# Patient Record
Sex: Male | Born: 1985
Health system: Southern US, Community
[De-identification: ages and names within clinical notes are randomized; demographics above are authoritative.]

## PROBLEM LIST (undated history)

## (undated) DIAGNOSIS — I82409 Acute embolism and thrombosis of unspecified deep veins of unspecified lower extremity: Secondary | ICD-10-CM

## (undated) DIAGNOSIS — M1990 Unspecified osteoarthritis, unspecified site: Secondary | ICD-10-CM

## (undated) DIAGNOSIS — T7840XA Allergy, unspecified, initial encounter: Secondary | ICD-10-CM

## (undated) DIAGNOSIS — I10 Essential (primary) hypertension: Secondary | ICD-10-CM

## (undated) HISTORY — DX: Unspecified osteoarthritis, unspecified site: M19.90

## (undated) HISTORY — DX: Allergy, unspecified, initial encounter: T78.40XA

---

## 2004-09-09 ENCOUNTER — Emergency Department: Payer: Self-pay | Admitting: Emergency Medicine

## 2007-11-21 ENCOUNTER — Emergency Department: Payer: Self-pay | Admitting: Emergency Medicine

## 2010-04-15 ENCOUNTER — Emergency Department: Payer: Self-pay | Admitting: Emergency Medicine

## 2011-06-07 ENCOUNTER — Emergency Department: Payer: Self-pay | Admitting: Emergency Medicine

## 2011-06-07 LAB — COMPREHENSIVE METABOLIC PANEL
Alkaline Phosphatase: 82 U/L (ref 50–136)
Anion Gap: 10 (ref 7–16)
BUN: 13 mg/dL (ref 7–18)
Calcium, Total: 9.3 mg/dL (ref 8.5–10.1)
Chloride: 106 mmol/L (ref 98–107)
Co2: 23 mmol/L (ref 21–32)
Creatinine: 1.43 mg/dL — ABNORMAL HIGH (ref 0.60–1.30)
EGFR (African American): >60
EGFR (Non-African Amer.): >60
Glucose: 104 mg/dL — ABNORMAL HIGH (ref 65–99)
Potassium: 4.1 mmol/L (ref 3.5–5.1)
Sodium: 139 mmol/L (ref 136–145)

## 2011-06-07 LAB — CBC
HGB: 15.6 g/dL (ref 13.0–18.0)
MCHC: 34.1 g/dL (ref 32.0–36.0)
Platelet: 167 10*3/uL (ref 150–440)
WBC: 9.5 10*3/uL (ref 3.8–10.6)

## 2011-06-07 LAB — TROPONIN I: Troponin-I: <0.02 ng/mL

## 2011-06-07 LAB — CK: CK, Total: 600 U/L — ABNORMAL HIGH (ref 35–232)

## 2013-10-30 ENCOUNTER — Emergency Department: Payer: Self-pay | Admitting: Emergency Medicine

## 2014-08-12 ENCOUNTER — Encounter: Payer: Self-pay | Admitting: Emergency Medicine

## 2014-08-12 ENCOUNTER — Emergency Department
Admission: EM | Admit: 2014-08-12 | Discharge: 2014-08-12 | Disposition: A | Discharge status: Home / Self Care | Payer: BLUE CROSS/BLUE SHIELD | Attending: Emergency Medicine | Admitting: Emergency Medicine

## 2014-08-12 DIAGNOSIS — S39012A Strain of muscle, fascia and tendon of lower back, initial encounter: Secondary | ICD-10-CM | POA: Insufficient documentation

## 2014-08-12 DIAGNOSIS — S3992XA Unspecified injury of lower back, initial encounter: Secondary | ICD-10-CM | POA: Diagnosis present

## 2014-08-12 DIAGNOSIS — X58XXXA Exposure to other specified factors, initial encounter: Secondary | ICD-10-CM | POA: Insufficient documentation

## 2014-08-12 DIAGNOSIS — Y92310 Basketball court as the place of occurrence of the external cause: Secondary | ICD-10-CM | POA: Insufficient documentation

## 2014-08-12 DIAGNOSIS — Y998 Other external cause status: Secondary | ICD-10-CM | POA: Diagnosis not present

## 2014-08-12 DIAGNOSIS — Y9367 Activity, basketball: Secondary | ICD-10-CM | POA: Insufficient documentation

## 2014-08-12 MED ORDER — DIAZEPAM 5 MG PO TABS
ORAL_TABLET | ORAL | Status: AC
  Administered 2014-08-12: 5 mg via ORAL
  Filled 2014-08-12: qty 1

## 2014-08-12 MED ORDER — DIAZEPAM 5 MG PO TABS
5.0000 mg | ORAL_TABLET | Freq: Once | ORAL | Status: AC
  Administered 2014-08-12: 5 mg via ORAL

## 2014-08-12 MED ORDER — IBUPROFEN 800 MG PO TABS: 800.0000 mg | ORAL_TABLET | Freq: Three times a day (TID) | ORAL | Status: DC | PRN

## 2014-08-12 MED ORDER — KETOROLAC TROMETHAMINE 60 MG/2ML IM SOLN
60.0000 mg | Freq: Once | INTRAMUSCULAR | Status: AC
  Administered 2014-08-12: 60 mg via INTRAMUSCULAR

## 2014-08-12 MED ORDER — KETOROLAC TROMETHAMINE 60 MG/2ML IM SOLN
INTRAMUSCULAR | Status: AC
  Administered 2014-08-12: 60 mg via INTRAMUSCULAR
  Filled 2014-08-12: qty 2

## 2014-08-12 MED ORDER — DIAZEPAM 2 MG PO TABS: 2.0000 mg | ORAL_TABLET | Freq: Three times a day (TID) | ORAL | Status: DC | PRN

## 2014-08-12 NOTE — ED Notes (Signed)
Pt was playing basketball at 1830 when he reached up to grab ball and "pain shot down my back and lower leg". Pt complains of low back pain and left posterior leg pain. Cms intact to all toes.

## 2014-08-12 NOTE — Discharge Instructions (Signed)
Back Pain, Adult Low back pain is very common. About 1 in 5 people have back pain.The cause of low back pain is rarely dangerous. The pain often gets better over time.About half of people with a sudden onset of back pain feel better in just 2 weeks. About 8 in 10 people feel better by 6 weeks.  CAUSES Some common causes of back pain include:  Strain of the muscles or ligaments supporting the spine.  Wear and tear (degeneration) of the spinal discs.  Arthritis.  Direct injury to the back. DIAGNOSIS Most of the time, the direct cause of low back pain is not known.However, back pain can be treated effectively even when the exact cause of the pain is unknown.Answering your caregiver's questions about your overall health and symptoms is one of the most accurate ways to make sure the cause of your pain is not dangerous. If your caregiver needs more information, he or she may order lab work or imaging tests (X-rays or MRIs).However, even if imaging tests show changes in your back, this usually does not require surgery. HOME CARE INSTRUCTIONS For many people, back pain returns.Since low back pain is rarely dangerous, it is often a condition that people can learn to manageon their own.   Remain active. It is stressful on the back to sit or stand in one place. Do not sit, drive, or stand in one place for more than 30 minutes at a time. Take short walks on level surfaces as soon as pain allows.Try to increase the length of time you walk each day.  Do not stay in bed.Resting more than 1 or 2 days can delay your recovery.  Do not avoid exercise or work.Your body is made to move.It is not dangerous to be active, even though your back may hurt.Your back will likely heal faster if you return to being active before your pain is gone.  Pay attention to your body when you bend and lift. Many people have less discomfortwhen lifting if they bend their knees, keep the load close to their bodies,and  avoid twisting. Often, the most comfortable positions are those that put less stress on your recovering back.  Find a comfortable position to sleep. Use a firm mattress and lie on your side with your knees slightly bent. If you lie on your back, put a pillow under your knees.  Only take over-the-counter or prescription medicines as directed by your caregiver. Over-the-counter medicines to reduce pain and inflammation are often the most helpful.Your caregiver may prescribe muscle relaxant drugs.These medicines help dull your pain so you can more quickly return to your normal activities and healthy exercise.  Put ice on the injured area.  Put ice in a plastic bag.  Place a towel between your skin and the bag.  Leave the ice on for 15-20 minutes, 03-04 times a day for the first 2 to 3 days. After that, ice and heat may be alternated to reduce pain and spasms.  Ask your caregiver about trying back exercises and gentle massage. This may be of some benefit.  Avoid feeling anxious or stressed.Stress increases muscle tension and can worsen back pain.It is important to recognize when you are anxious or stressed and learn ways to manage it.Exercise is a great option. SEEK MEDICAL CARE IF:  You have pain that is not relieved with rest or medicine.  You have pain that does not improve in 1 week.  You have new symptoms.  You are generally not feeling well. SEEK   IMMEDIATE MEDICAL CARE IF:   You have pain that radiates from your back into your legs.  You develop new bowel or bladder control problems.  You have unusual weakness or numbness in your arms or legs.  You develop nausea or vomiting.  You develop abdominal pain.  You feel faint. Document Released: 02/02/2005 Document Revised: 08/04/2011 Document Reviewed: 06/06/2013 [employer] Patient Information 2015 [employer]. This information is not intended to replace advice given to you by your health care provider. Make sure you  discuss any questions you have with your health care provider.  

## 2014-08-12 NOTE — ED Notes (Signed)
Patient with complaint of left lower back pain that radiates down his left leg that started about 3 hours ago.

## 2014-08-12 NOTE — ED Provider Notes (Signed)
[facility] Emergency Department Provider Note ____________________________________________  Time seen: Approximately 10:48 PM  I have reviewed the triage vital signs and the nursing notes.   HISTORY  Chief Complaint Back Pain   HPI HULET EHRMANN is a 29 y.o. male who presents to the emergency department for left lower back pain. He states that he was playing basketball and went up to catch a shot and felt a pop and sudden pain in his back. He states the pain radiates into the left leg posteriorly into the left knee. He has never had this type of pain before. Pain is worse with movement.   History reviewed. No pertinent past medical history.  There are no active problems to display for this patient.   History reviewed. No pertinent past surgical history.  Current Outpatient Rx  Name  Route  Sig  Dispense  Refill  . diazepam (VALIUM) 2 MG tablet   Oral   Take 1 tablet (2 mg total) by mouth every 8 (eight) hours as needed for anxiety.   30 tablet   0   . ibuprofen (ADVIL,MOTRIN) 800 MG tablet   Oral   Take 1 tablet (800 mg total) by mouth every 8 (eight) hours as needed.   30 tablet   0     Allergies Review of patient's allergies indicates no known allergies.  No family history on file.  Social History History  Substance Use Topics  . Smoking status: Not on file  . Smokeless tobacco: Not on file  . Alcohol Use: Not on file    Review of Systems Constitutional: No recent illness. Eyes: No visual changes. ENT: No sore throat. Cardiovascular: Denies chest pain or palpitations. Respiratory: Denies shortness of breath. Gastrointestinal: No abdominal pain.  Genitourinary: Negative for dysuria. Musculoskeletal: Pain in left lumbar area with radiation into the left lower extremity. Skin: Negative for rash. Neurological: Negative for headaches, focal weakness or numbness. 10-point ROS otherwise  negative.  ____________________________________________   PHYSICAL EXAM:  VITAL SIGNS: ED Triage Vitals  Enc Vitals Group     BP 08/12/14 2026 119/78 mmHg     Pulse Rate 08/12/14 2026 73     Resp 08/12/14 2026 20     Temp 08/12/14 2026 97.5 F (36.4 C)     Temp Source 08/12/14 2026 Oral     SpO2 08/12/14 2005 98 %     Weight 08/12/14 2026 271 lb (122.925 kg)     Height 08/12/14 2026  (1.93 m)     Head Cir --      Peak Flow --      Pain Score 08/12/14 2026 8     Pain Loc --      Pain Edu? --      Excl. in GC? --     Constitutional: Alert and oriented. Well appearing and in no acute distress. Eyes: Conjunctivae are normal. EOMI. Head: Atraumatic. Nose: No congestion/rhinnorhea. Neck: No stridor.  Respiratory: Normal respiratory effort.   Musculoskeletal: Full range of motion possible but painful flexion. Straight leg raise induces sciatic pain at approximately 45-50. Neurologic:  Normal speech and language. No gross focal neurologic deficits are appreciated. Speech is normal. Antalgic gait. No loss of bowel or bladder control. Skin:  Skin is warm, dry and intact. Atraumatic. Psychiatric: Mood and affect are normal. Speech and behavior are normal.  ____________________________________________   LABS (all labs ordered are listed, but only abnormal results are displayed)  Labs Reviewed - No data to  display ____________________________________________  RADIOLOGY  Not indicated ____________________________________________   PROCEDURES  Procedure(s) performed: None   ____________________________________________   INITIAL IMPRESSION / ASSESSMENT AND PLAN / ED COURSE  Pertinent labs & imaging results that were available during my care of the patient were reviewed by me and considered in my medical decision making (see chart for details).  Toradol IM given in the emergency department and 5 mg of Valium. Patient was advised to use ice 20 minutes per hour on  the lower back. He was advised to rest over the next couple of days. He was advised to follow up with orthopedics or primary care if the pain does not improve. He was advised to return to emergency department for symptoms that change or worsen if he is unable to schedule an appointment. ____________________________________________   FINAL CLINICAL IMPRESSION(S) / ED DIAGNOSES  Final diagnoses:  Back strain, initial encounter       Chinita Pester, FNP 08/12/14 2252  Phineas Semen, MD 08/12/14 2259

## 2014-12-13 ENCOUNTER — Emergency Department
Admission: EM | Admit: 2014-12-13 | Discharge: 2014-12-13 | Disposition: A | Discharge status: Home / Self Care | Payer: BLUE CROSS/BLUE SHIELD | Attending: Emergency Medicine | Admitting: Emergency Medicine

## 2014-12-13 ENCOUNTER — Encounter: Payer: Self-pay | Admitting: Emergency Medicine

## 2014-12-13 ENCOUNTER — Emergency Department: Payer: BLUE CROSS/BLUE SHIELD

## 2014-12-13 DIAGNOSIS — M545 Low back pain, unspecified: Secondary | ICD-10-CM

## 2014-12-13 DIAGNOSIS — M5136 Other intervertebral disc degeneration, lumbar region: Secondary | ICD-10-CM | POA: Diagnosis not present

## 2014-12-13 DIAGNOSIS — M51369 Other intervertebral disc degeneration, lumbar region without mention of lumbar back pain or lower extremity pain: Secondary | ICD-10-CM

## 2014-12-13 MED ORDER — MELOXICAM 15 MG PO TABS: 15.0000 mg | ORAL_TABLET | Freq: Every day | ORAL | Status: DC

## 2014-12-13 NOTE — ED Provider Notes (Signed)
[facility] Emergency Department Provider Note  ____________________________________________  Time seen: Approximately 12:44 PM  I have reviewed the triage vital signs and the nursing notes.   HISTORY  Chief Complaint Back Pain    HPI Stuart Mclaughlin is a 29 y.o. male who presents to the emergency department for evaluation of back pain. He states about 3 months ago he injured his back while playing basketball. He states that he jumped up for a layup and felt something pull in his back then landed hard on that right leg. He was evaluated after the injury, but no x-ray was taken. He states that he has begun to have pain in the same area of his back, but it is now radiating down his right leg. He denies new injury. He has not been taken anything for pain. He denies loss of bowel or bladder control.   History reviewed. No pertinent past medical history.  There are no active problems to display for this patient.   History reviewed. No pertinent past surgical history.  Current Outpatient Rx  Name  Route  Sig  Dispense  Refill  . diazepam (VALIUM) 2 MG tablet   Oral   Take 1 tablet (2 mg total) by mouth every 8 (eight) hours as needed for anxiety.   30 tablet   0   . ibuprofen (ADVIL,MOTRIN) 800 MG tablet   Oral   Take 1 tablet (800 mg total) by mouth every 8 (eight) hours as needed.   30 tablet   0   . meloxicam (MOBIC) 15 MG tablet   Oral   Take 1 tablet (15 mg total) by mouth daily.   30 tablet   2     Allergies Review of patient's allergies indicates no known allergies.  No family history on file.  Social History Social History  Substance Use Topics  . Smoking status: Never Smoker   . Smokeless tobacco: None  . Alcohol Use: Yes    Review of Systems Constitutional: No recent illness. Eyes: No visual changes. ENT: No sore throat. Cardiovascular: Denies chest pain or palpitations. Respiratory: Denies shortness of  breath. Gastrointestinal: No abdominal pain.  Genitourinary: Negative for dysuria. Musculoskeletal: Pain in right lower back Skin: Negative for rash. Neurological: Negative for headaches, focal weakness or numbness. 10-point ROS otherwise negative.  ____________________________________________   PHYSICAL EXAM:  VITAL SIGNS: ED Triage Vitals  Enc Vitals Group     BP 12/13/14 1223 147/94 mmHg     Pulse Rate 12/13/14 1223 61     Resp 12/13/14 1223 14     Temp 12/13/14 1223 97.7 F (36.5 C)     Temp Source 12/13/14 1223 Oral     SpO2 12/13/14 1223 97 %     Weight 12/13/14 1223 277 lb (125.646 kg)     Height 12/13/14 1223  (1.93 m)     Head Cir --      Peak Flow --      Pain Score 12/13/14 1224 7     Pain Loc --      Pain Edu? --      Excl. in GC? --     Constitutional: Alert and oriented. Well appearing and in no acute distress. Eyes: Conjunctivae are normal. EOMI. Head: Atraumatic. Nose: No congestion/rhinnorhea. Neck: No stridor.  Respiratory: Normal respiratory effort.   Musculoskeletal: Tenderness into the right lower back. Straight leg raise is positive at about 50. Full range of motion of all extremities. Neurologic:  Normal  speech and language. No gross focal neurologic deficits are appreciated. Speech is normal. No gait instability. Skin:  Skin is warm, dry and intact. Atraumatic. Psychiatric: Mood and affect are normal. Speech and behavior are normal.  ____________________________________________   LABS (all labs ordered are listed, but only abnormal results are displayed)  Labs Reviewed - No data to display ____________________________________________  RADIOLOGY  No acute bony abnormality of the lumbar spine per radiology. There is mild disc degeneration noted. ____________________________________________   PROCEDURES  Procedure(s) performed: None   ____________________________________________   INITIAL IMPRESSION / ASSESSMENT AND PLAN /  ED COURSE  Pertinent labs & imaging results that were available during my care of the patient were reviewed by me and considered in my medical decision making (see chart for details).  Patient was instructed to follow-up with orthopedics. He was advised to take medication as prescribed. He was advised to return to the emergency department for symptoms that change or worsen if he is unable schedule an appointment. ____________________________________________   FINAL CLINICAL IMPRESSION(S) / ED DIAGNOSES  Final diagnoses:  Acute lumbar back pain  Degenerative disc disease, lumbar       Chinita PesterCari B Hollie Wojahn, FNP 12/13/14 1807  Emily FilbertJonathan E Williams, MD 12/14/14 2249

## 2014-12-13 NOTE — Discharge Instructions (Signed)

## 2014-12-13 NOTE — ED Notes (Signed)
Low back pain. Hx of same, but it is going down right leg now

## 2015-03-12 ENCOUNTER — Emergency Department: Payer: BLUE CROSS/BLUE SHIELD

## 2015-03-12 ENCOUNTER — Encounter: Payer: Self-pay | Admitting: Emergency Medicine

## 2015-03-12 ENCOUNTER — Emergency Department
Admission: EM | Admit: 2015-03-12 | Discharge: 2015-03-12 | Disposition: A | Discharge status: Home / Self Care | Payer: BLUE CROSS/BLUE SHIELD | Attending: Emergency Medicine | Admitting: Emergency Medicine

## 2015-03-12 DIAGNOSIS — M25561 Pain in right knee: Secondary | ICD-10-CM | POA: Diagnosis present

## 2015-03-12 DIAGNOSIS — I8391 Asymptomatic varicose veins of right lower extremity: Secondary | ICD-10-CM | POA: Insufficient documentation

## 2015-03-12 DIAGNOSIS — M79604 Pain in right leg: Secondary | ICD-10-CM

## 2015-03-12 MED ORDER — KETOROLAC TROMETHAMINE 60 MG/2ML IM SOLN
INTRAMUSCULAR | Status: AC
  Administered 2015-03-12: 60 mg via INTRAMUSCULAR
  Filled 2015-03-12: qty 2

## 2015-03-12 MED ORDER — KETOROLAC TROMETHAMINE 60 MG/2ML IM SOLN
60.0000 mg | Freq: Once | INTRAMUSCULAR | Status: AC
  Administered 2015-03-12: 60 mg via INTRAMUSCULAR

## 2015-03-12 MED ORDER — NAPROXEN 500 MG PO TABS: 500.0000 mg | ORAL_TABLET | Freq: Two times a day (BID) | ORAL | Status: DC

## 2015-03-12 MED ORDER — TRAMADOL HCL 50 MG PO TABS: 50.0000 mg | ORAL_TABLET | Freq: Four times a day (QID) | ORAL | Status: DC | PRN

## 2015-03-12 NOTE — Discharge Instructions (Signed)
Cryotherapy °Cryotherapy means treatment with cold. Ice or gel packs can be used to reduce both pain and swelling. Ice is the most helpful within the first 24 to 48 hours after an injury or flare-up from overusing a muscle or joint. Sprains, strains, spasms, burning pain, shooting pain, and aches can all be eased with ice. Ice can also be used when recovering from surgery. Ice is effective, has very few side effects, and is safe for most people to use. °PRECAUTIONS  °Ice is not a safe treatment option for people with: °· Raynaud phenomenon. This is a condition affecting small blood vessels in the extremities. Exposure to cold may cause your problems to return. °· Cold hypersensitivity. There are many forms of cold hypersensitivity, including: °· Cold urticaria. Red, itchy hives appear on the skin when the tissues begin to warm after being iced. °· Cold erythema. This is a red, itchy rash caused by exposure to cold. °· Cold hemoglobinuria. Red blood cells break down when the tissues begin to warm after being iced. The hemoglobin that carry oxygen are passed into the urine because they cannot combine with blood proteins fast enough. °· Numbness or altered sensitivity in the area being iced. °If you have any of the following conditions, do not use ice until you have discussed cryotherapy with your caregiver: °· Heart conditions, such as arrhythmia, angina, or chronic heart disease. °· High blood pressure. °· Healing wounds or open skin in the area being iced. °· Current infections. °· Rheumatoid arthritis. °· Poor circulation. °· Diabetes. °Ice slows the blood flow in the region it is applied. This is beneficial when trying to stop inflamed tissues from spreading irritating chemicals to surrounding tissues. However, if you expose your skin to cold temperatures for too long or without the proper protection, you can damage your skin or nerves. Watch for signs of skin damage due to cold. °HOME CARE INSTRUCTIONS °Follow  these tips to use ice and cold packs safely. °· Place a dry or damp towel between the ice and skin. A damp towel will cool the skin more quickly, so you may need to shorten the time that the ice is used. °· For a more rapid response, add gentle compression to the ice. °· Ice for no more than 10 to 20 minutes at a time. The bonier the area you are icing, the less time it will take to get the benefits of ice. °· Check your skin after 5 minutes to make sure there are no signs of a poor response to cold or skin damage. °· Rest 20 minutes or more between uses. °· Once your skin is numb, you can end your treatment. You can test numbness by very lightly touching your skin. The touch should be so light that you do not see the skin dimple from the pressure of your fingertip. When using ice, most people will feel these normal sensations in this order: cold, burning, aching, and numbness. °· Do not use ice on someone who cannot communicate their responses to pain, such as small children or people with dementia. °HOW TO MAKE AN ICE PACK °Ice packs are the most common way to use ice therapy. Other methods include ice massage, ice baths, and cryosprays. Muscle creams that cause a cold, tingly feeling do not offer the same benefits that ice offers and should not be used as a substitute unless recommended by your caregiver. °To make an ice pack, do one of the following: °· Place crushed ice or a   bag of frozen vegetables in a sealable plastic bag. Squeeze out the excess air. Place this bag inside another plastic bag. Slide the bag into a pillowcase or place a damp towel between your skin and the bag.  Mix 3 parts water with 1 part rubbing alcohol. Freeze the mixture in a sealable plastic bag. When you remove the mixture from the freezer, it will be slushy. Squeeze out the excess air. Place this bag inside another plastic bag. Slide the bag into a pillowcase or place a damp towel between your skin and the bag. SEEK MEDICAL CARE  IF:  You develop white spots on your skin. This may give the skin a blotchy (mottled) appearance.  Your skin turns blue or pale.  Your skin becomes waxy or hard.  Your swelling gets worse. MAKE SURE YOU:   Understand these instructions.  Will watch your condition.  Will get help right away if you are not doing well or get worse.   This information is not intended to replace advice given to you by your health care provider. Make sure you discuss any questions you have with your health care provider.   Document Released: 09/29/2010 Document Revised: 02/23/2014 Document Reviewed: 09/29/2010 Elsevier Interactive Patient Education 2016 [employer].  Joint Pain Joint pain, which is also called arthralgia, can be caused by many things. Joint pain often goes away when you follow your health care provider's instructions for relieving pain at home. However, joint pain can also be caused by conditions that require further treatment. Common causes of joint pain include:  Bruising in the area of the joint.  Overuse of the joint.  Wear and tear on the joints that occur with aging (osteoarthritis).  Various other forms of arthritis.  A buildup of a crystal form of uric acid in the joint (gout).  Infections of the joint (septic arthritis) or of the bone (osteomyelitis). Your health care provider may recommend medicine to help with the pain. If your joint pain continues, additional tests may be needed to diagnose your condition. HOME CARE INSTRUCTIONS Watch your condition for any changes. Follow these instructions as directed to lessen the pain that you are feeling.  Take medicines only as directed by your health care provider.  Rest the affected area for as long as your health care provider says that you should. If directed to do so, raise the painful joint above the level of your heart while you are sitting or lying down.  Do not do things that cause or worsen pain.  If directed,  apply ice to the painful area:  Put ice in a plastic bag.  Place a towel between your skin and the bag.  Leave the ice on for 20 minutes, 2-3 times per day.  Wear an elastic bandage, splint, or sling as directed by your health care provider. Loosen the elastic bandage or splint if your fingers or toes become numb and tingle, or if they turn cold and blue.  Begin exercising or stretching the affected area as directed by your health care provider. Ask your health care provider what types of exercise are safe for you.  Keep all follow-up visits as directed by your health care provider. This is important. SEEK MEDICAL CARE IF:  Your pain increases, and medicine does not help.  Your joint pain does not improve within 3 days.  You have increased bruising or swelling.  You have a fever.  You lose 10 lb (4.5 kg) or more without trying. SEEK IMMEDIATE  MEDICAL CARE IF:  You are not able to move the joint.  Your fingers or toes become numb or they turn cold and blue.   This information is not intended to replace advice given to you by your health care provider. Make sure you discuss any questions you have with your health care provider.   Document Released: 02/02/2005 Document Revised: 02/23/2014 Document Reviewed: 11/14/2013 Elsevier Interactive Patient Education 2016 [employer]  Varicose Veins Varicose veins are veins that have become enlarged and twisted. They are usually seen in the legs but can occur in other parts of the body as well. CAUSES This condition is the result of valves in the veins not working properly. Valves in the veins help to return blood from the leg to the heart. If these valves are damaged, blood flows backward and backs up into the veins in the leg near the skin. This causes the veins to become larger. RISK FACTORS People who are on their feet a lot, who are pregnant, or who are overweight are more likely to develop varicose veins. SIGNS AND  SYMPTOMS  Bulging, twisted-appearing, bluish veins, most commonly found on the legs.  Leg pain or a feeling of heaviness. These symptoms may be worse at the end of the day.  Leg swelling.  Changes in skin color. DIAGNOSIS A health care provider can usually diagnose varicose veins by examining your legs. Your health care provider may also recommend an ultrasound of your leg veins. TREATMENT Most varicose veins can be treated at home.However, other treatments are available for people who have persistent symptoms or want to improve the cosmetic appearance of the varicose veins. These treatment options include:  Sclerotherapy. A solution is injected into the vein to close it off.  Laser treatment. A laser is used to heat the vein to close it off.  Radiofrequency vein ablation. An electrical current produced by radio waves is used to close off the vein.  Phlebectomy. The vein is surgically removed through small incisions made over the varicose vein.  Vein ligation and stripping. The vein is surgically removed through incisions made over the varicose vein after the vein has been tied (ligated). HOME CARE INSTRUCTIONS  Do not stand or sit in one position for long periods of time. Do not sit with your legs crossed. Rest with your legs raised during the day.  Wear compression stockings as directed by your health care provider. These stockings help to prevent blood clots and reduce swelling in your legs.  Do not wear other tight, encircling garments around your legs, pelvis, or waist.  Walk as much as possible to increase blood flow.  Raise the foot of your bed at night with 2-inch blocks.  If you get a cut in the skin over the vein and the vein bleeds, lie down with your leg raised and press on it with a clean cloth until the bleeding stops. Then place a bandage (dressing) on the cut. See your health care provider if it continues to bleed. SEEK MEDICAL CARE IF:  The skin around your  ankle starts to break down.  You have pain, redness, tenderness, or hard swelling in your leg over a vein.  You are uncomfortable because of leg pain.   This information is not intended to replace advice given to you by your health care provider. Make sure you discuss any questions you have with your health care provider.   Document Released: 11/12/2004 Document Revised: 02/23/2014 Document Reviewed: 06/20/2013 Elsevier Interactive Patient Education  2016 Elsevier Inc. ° °

## 2015-03-12 NOTE — ED Notes (Signed)
Woke up with pain to right knee and ankle this am  Unsure of injury  Had slight pain to knee last pm

## 2015-03-12 NOTE — ED Notes (Signed)
States he noticed some slight pain to right knee last pm  But woke up this am with swelling to right ankle and unable to bear wt to right leg  Denies any injury   Good sensation and pulses

## 2015-03-12 NOTE — ED Provider Notes (Signed)
[facility] Emergency Department Provider Note  ____________________________________________  Time seen: Approximately 7:45 AM  I have reviewed the triage vital signs and the nursing notes.   HISTORY  Chief Complaint Leg Pain    HPI Stuart Mclaughlin is a 30 y.o. male, NAD. Presents to the emergency department this morning with sudden onset of severe right knee pain radiating down to the right ankle since last night.  Has had some off and on right joint pain over the last year due to increased physical activity but has not participated in heavy physical activity over the last 7 months. Pain this morning was more than he had experienced in the past. Denies any injuries, falls, traumas. No recent long-term travel by air or land vehicle. Has not noted any redness, swelling, or warmth. Denies any edema. No personal history of arthritis or gout. Is not currently taking any medications. Did try 800 mg ibuprofen with little relief. Denies chest pain, palpitations, headache, abdominal pain.   History reviewed. No pertinent past medical history.  There are no active problems to display for this patient.   No past surgical history on file.  Current Outpatient Rx  Name  Route  Sig  Dispense  Refill  . diazepam (VALIUM) 2 MG tablet   Oral   Take 1 tablet (2 mg total) by mouth every 8 (eight) hours as needed for anxiety.   30 tablet   0   . ibuprofen (ADVIL,MOTRIN) 800 MG tablet   Oral   Take 1 tablet (800 mg total) by mouth every 8 (eight) hours as needed.   30 tablet   0   . naproxen (NAPROSYN) 500 MG tablet   Oral   Take 1 tablet (500 mg total) by mouth 2 (two) times daily with a meal.   28 tablet   0   . traMADol (ULTRAM) 50 MG tablet   Oral   Take 1 tablet (50 mg total) by mouth every 6 (six) hours as needed for severe pain.   10 tablet   0     Allergies Review of patient's allergies indicates no known allergies.  No family history on  file.  Social History Social History  Substance Use Topics  . Smoking status: Never Smoker   . Smokeless tobacco: None  . Alcohol Use: Yes     Review of Systems Constitutional: No fever/chills, weakness. No fatigue. Eyes: No visual changes.  Cardiovascular: No chest pain. No palpitations.  Respiratory: No cough. No shortness of breath. Gastrointestinal: No abdominal pain.  No nausea, no vomiting.   Musculoskeletal: Negative for back pain. Positive for right anterior knee pain. Right lateral ankle pain. Radiation of pain from the right knee to the right ankle. No swelling. No edema. Skin: Negative for rash. No lacerations. No skin source. No erythema or warmth. Neurological: Negative for headaches, focal weakness or numbness. 10-point ROS otherwise negative.  ____________________________________________   PHYSICAL EXAM:  VITAL SIGNS: ED Triage Vitals  Enc Vitals Group     BP 03/12/15 0742 133/80 mmHg     Pulse Rate 03/12/15 0742 74     Resp 03/12/15 0742 20     Temp 03/12/15 0742 99 F (37.2 C)     Temp Source 03/12/15 0742 Oral     SpO2 03/12/15 0742 99 %     Weight 03/12/15 0742 275 lb (124.739 kg)     Height 03/12/15 0742  (1.93 m)     Head Cir --  Peak Flow --      Pain Score 03/12/15 0743 8     Pain Loc --      Pain Edu? --      Excl. in GC? --     Constitutional: Alert and oriented. Well appearing and in no acute distress.  Eyes: Conjunctivae are normal. PERRL. EOMI without pain.  Head: Atraumatic. ENT:      Ears:       Nose: No congestion/rhinnorhea.      Mouth/Throat:  Neck: No stridor. Supple with full range of motion. Hematological/Lymphatic/Immunilogical: No cervical lymphadenopathy. Cardiovascular: Normal rate, regular rhythm. Normal S1 and S2.  Good peripheral circulation. Edematous vein noted right posterior calf without tenderness to palpation. Respiratory: Normal respiratory effort without tachypnea or retractions. Lungs  CTAB. Musculoskeletal: Tenderness to palpation of the right anterior superior knee and right lateral ankle. Warmth appreciated with palpation of the right lateral ankle. Decreased active range of motion of right ankle and right knee secondary to pain. Passive range of motion attempted with stiffness of the right knee and ankle. No crepitus. Neurologic:  Normal speech and language. No gross focal neurologic deficits are appreciated.  Skin:  Right toes very cool to touch. Right lateral ankle warm to palpation. No lesions, skin source, lacerations. Psychiatric: Mood and affect are normal. Speech and behavior are normal. Patient exhibits appropriate insight and judgement.   ____________________________________________   LABS (all labs ordered are listed, but only abnormal results are displayed)  Labs Reviewed - No data to display ____________________________________________   RADIOLOGY I, Hagler,Jami L, personally viewed and evaluated these images (plain radiographs) as part of my medical decision making, as well as reviewing the written report by the radiologist.  US Venous Img Lower Unilateral Right  03/12/2015  CLINICAL DATA:  Right lower extremity pain x1 day, edema EXAM: RIGHT LOWER EXTREMITY VENOUS DOPPLER ULTRASOUND TECHNIQUE: Gray-scale sonography with compression, as well as color and duplex ultrasound, were performed to evaluate the deep venous system from the level of the common femoral vein through the popliteal and proximal calf veins. COMPARISON:  None FINDINGS: Normal compressibility of the common femoral, superficial femoral, and popliteal veins, as well as the proximal calf veins. No filling defects to suggest DVT on grayscale or color Doppler imaging. Doppler waveforms show normal direction of venous flow, normal respiratory phasicity and response to augmentation. Survey views of the contralateral common femoral vein are unremarkable. IMPRESSION: 1. No evidence of lower extremity  deep vein thrombosis, RIGHT. Electronically Signed   By: Corlis Leak M.D.   On: 03/12/2015 10:04    ____________________________________________    PROCEDURES  Procedure(s) performed: None      Medications  ketorolac (TORADOL) injection 60 mg (60 mg Intramuscular Given 03/12/15 0828)     ____________________________________________   INITIAL IMPRESSION / ASSESSMENT AND PLAN / ED COURSE  Pertinent labs & imaging results that were available during my care of the patient were reviewed by me and considered in my medical decision making (see chart for details).  Patient's diagnosis is consistent with arthralgias of the right knee. Patient will be discharged home with prescriptions for Naprosyn to be taken twice daily and tramadol to be taken as needed, sparingly for pain. Patient [facility] to establish care and follow-up for current symptoms. Also advised following up with a vascular specialist to further evaluate and manage right lower extremity vessels.  Patient is given ED precautions to return to the ED for any worsening or  new symptoms.     ____________________________________________  FINAL CLINICAL IMPRESSION(S) / ED DIAGNOSES  Final diagnoses:  Arthralgia of right knee  Varicose veins of right lower extremity      NEW MEDICATIONS STARTED DURING THIS VISIT:  New Prescriptions   NAPROXEN (NAPROSYN) 500 MG TABLET    Take 1 tablet (500 mg total) by mouth 2 (two) times daily with a meal.   TRAMADOL (ULTRAM) 50 MG TABLET    Take 1 tablet (50 mg total) by mouth every 6 (six) hours as needed for severe pain.          Hope Pigeon, PA-C 03/12/15 1029  Governor Rooks, MD 03/12/15 [phone]

## 2015-03-21 ENCOUNTER — Emergency Department (EMERGENCY_DEPARTMENT_HOSPITAL)
Admit: 2015-03-21 | Discharge: 2015-03-21 | Disposition: A | Discharge status: Home / Self Care | Payer: BLUE CROSS/BLUE SHIELD | Attending: Emergency Medicine | Admitting: Emergency Medicine

## 2015-03-21 ENCOUNTER — Emergency Department (HOSPITAL_COMMUNITY): Payer: BLUE CROSS/BLUE SHIELD

## 2015-03-21 ENCOUNTER — Encounter (HOSPITAL_COMMUNITY): Payer: Self-pay

## 2015-03-21 ENCOUNTER — Emergency Department (HOSPITAL_COMMUNITY)
Admission: EM | Admit: 2015-03-21 | Discharge: 2015-03-21 | Disposition: A | Discharge status: Home / Self Care | Payer: BLUE CROSS/BLUE SHIELD | Attending: Emergency Medicine | Admitting: Emergency Medicine

## 2015-03-21 DIAGNOSIS — Z79899 Other long term (current) drug therapy: Secondary | ICD-10-CM | POA: Diagnosis not present

## 2015-03-21 DIAGNOSIS — M7989 Other specified soft tissue disorders: Secondary | ICD-10-CM | POA: Insufficient documentation

## 2015-03-21 DIAGNOSIS — M79604 Pain in right leg: Secondary | ICD-10-CM | POA: Diagnosis not present

## 2015-03-21 DIAGNOSIS — M79609 Pain in unspecified limb: Secondary | ICD-10-CM

## 2015-03-21 DIAGNOSIS — Z86718 Personal history of other venous thrombosis and embolism: Secondary | ICD-10-CM | POA: Diagnosis not present

## 2015-03-21 MED ORDER — NAPROXEN 250 MG PO TABS
500.0000 mg | ORAL_TABLET | Freq: Once | ORAL | Status: AC
  Administered 2015-03-21: 500 mg via ORAL
  Filled 2015-03-21: qty 2

## 2015-03-21 MED ORDER — HYDROCODONE-ACETAMINOPHEN 5-325 MG PO TABS
1.0000 | ORAL_TABLET | Freq: Once | ORAL | Status: AC
  Administered 2015-03-21: 1 via ORAL
  Filled 2015-03-21: qty 1

## 2015-03-21 NOTE — Progress Notes (Signed)
VASCULAR LAB PRELIMINARY  PRELIMINARY  PRELIMINARY  PRELIMINARY  Right lower extremity venous duplex completed.    Preliminary report:  There is no obvious evidence of DVT or SVT noted in the right lower extremity.  There is sluggish flow noted in the common femoral and popliteal veins, but resolves with compression, therefore probably not indicative of a thrombus.   Tamina Cyphers, RVT 03/21/2015, 12:15 PM

## 2015-03-21 NOTE — Discharge Instructions (Signed)
Your ultrasound today shows resolved blood clot in your legs. Please continue to follow-up with Dr. Wyn Quaker. You will  also need to set up primary care physician to work-up why you had a blood clot. Return for worsening symptoms, including worsening swelling, numbness or weakness in your leg, fever, or any other symptoms concerning to you.  Continue taking anti-inflammatory medications as needed for pain control. Keep legs elevated and iced while at rest.  How to Use Compression Stockings Compression stockings are elastic socks that squeeze the legs. They help to increase blood flow to the legs, decrease swelling in the legs, and reduce the chance of developing blood clots in the lower legs. Compression stockings are often used by people who:  Are recovering from surgery.  Have poor circulation in their legs.  Are prone to getting blood clots in their legs.  Have varicose veins.  Sit or stay in bed for long periods of time. HOW TO USE COMPRESSION STOCKINGS Before you put on your compression stockings:  Make sure that they are the correct size. If you do not know your size, ask your health care provider.  Make sure that they are clean, dry, and in good condition.  Check them for rips and tears. Do not put them on if they are ripped or torn. Put your stockings on first thing in the morning, before you get out of bed. Keep them on for as long as your health care provider advises. When you are wearing your stockings:  Keep them as smooth as possible. Do not allow them to bunch up. It is especially important to prevent the stockings from bunching up around your toes or behind your knees.  Do not roll the stockings downward and leave them rolled down. This can decrease blood flow to your leg.  Change them right away if they become wet or dirty. When you take off your stockings, inspect your legs and feet. Anything that does not seem normal may require medical attention. Look for:  Open  sores.  Red spots.  Swelling. INFORMATION AND TIPS  Do not stop wearing your compression stockings without talking to your health care provider first.  Wash your stockings everyday with mild detergent in cold or warm water. Do not use bleach. Air-dry your stockings or dry them in a clothes dryer on low heat.  Replace your stockings every 3-6 months.  If skin moisturizing is part of your treatment plan, apply lotion or cream at night so that your skin will be dry when you put on the stockings in the morning. It is harder to put the stockings on when you have lotion on your legs or feet. SEEK MEDICAL CARE IF: Remove your stockings and seek medical care if:  You have a feeling of pins and needles in your feet or legs.  You have any new changes in your skin.  You have skin lesions that are getting worse.  You have swelling or pain that is getting worse. SEEK IMMEDIATE MEDICAL CARE IF:  You have numbness or tingling in your lower legs that does not get better immediately after you take the stockings off.  Your toes or feet become cold and blue.  You develop open sores or red spots on your legs that do not go away.  You see or feel a warm spot on your leg.  You have new swelling or soreness in your leg.  You are short of breath or you have chest pain for no reason.  You  have a rapid or irregular heartbeat.  You feel light-headed or dizzy.   This information is not intended to replace advice given to you by your health care provider. Make sure you discuss any questions you have with your health care provider.    Emergency Department Resource Guide 1) Find a Doctor and Pay Out of Pocket Although you won't have to find out who is covered by your insurance plan, it is a good idea to ask around and get recommendations. You will then need to call the office and see if the doctor you have chosen will accept you as a new patient and what types of options they offer for patients who  are self-pay. Some doctors offer discounts or will set up payment plans for their patients who do not have insurance, but you will need to ask so you aren't surprised when you get to your appointment.  2) Contact Your Local Health Department Not all health departments have doctors that can see patients for sick visits, but many do, so it is worth a call to see if yours does. If you don't know where your local health department is, you can check in your phone book. The CDC also has a tool to help you locate your state's health department, and many state websites also have listings of all of their local health departments.  3) Find a Walk-in Clinic If your illness is not likely to be very severe or complicated, you may want to try a walk in clinic. These are popping up all over the country in pharmacies, drugstores, and shopping centers. They're usually staffed by nurse practitioners or physician assistants that have been trained to treat common illnesses and complaints. They're usually fairly quick and inexpensive. However, if you have serious medical issues or chronic medical problems, these are probably not your best option.  No Primary Care Doctor: - Call Health Connect at  [phone] - they can help you locate a primary care doctor that  accepts your insurance, provides certain services, etc. - Physician Referral Service- [phone]  Chronic Pain Problems: Organization         Address  Phone   Notes  [facility]  [phone] Patients need to be referred by their primary care doctor.   Medication Assistance: Organization         Address  Phone   Notes  [facility] Medication [facility] [address] [place], [place] [address] [phone] --Must be a resident of [facility] -- Must have NO insurance coverage whatsoever (no Medicaid/ Medicare, etc.) -- The pt. MUST have a primary care doctor that directs their care regularly and follows  them in the community   [facility]  [phone]   [employer]  [phone]    Agencies that provide inexpensive medical care: Organization         Address  Phone   Notes  [facility]  [phone]   [facility]    [phone]   [facility] [address] [place], [place] [address] [phone]   [facility] [address] [place] [place] [phone]   [facility]    [phone]   [facility]    [phone]   [facility]  [address], [place] Phone:  [phone], Fax:  [phone] Hours of Operation:  9 am - 6 pm, M-F.  Also accepts Medicaid/Medicare and self-pay.  [facility]  [address], [place] Phone: [phone], Fax: [phone]. Hours of Operation:  8:30 am - 5:30 pm, M-F.  Also accepts Medicaid and self-pay.  [facility] [place] [address] [place] Phone: [phone]   [facility] [address] Natasha Bence [place], [place] [phone], Ext. [phone] Mondays & Thursdays: 7-9 AM.  First 15 patients are seen on a first come, first serve basis.    Medicaid-accepting [facility] Providers:  Organization         Address  Phone   Notes  [facility] [address], [place] [phone] Also accepts self-pay patients.  [facility] [address] Laurell Josephs [place], [place]  [phone]   [facility] [address], [place] [phone]   [facility] [address], [place] [phone]   Renaye Rakers [address], [place]   [phone] Only accepts [place] patients after they have their name applied to their card.   Self-Pay (no insurance) in [facility]:  Organization         Address  Phone   Notes  Sickle Cell  Patients, [facility] [address] [place], [place] [phone]   [facility] [address] [place], [place] [phone]   [facility] [place]  [address], [place] [phone]   [facility]/Dr. Osei-Bonsu  [address] [place] or [address], [place] [phone] Phone number for both [place] and [place] locations is the same.  [facility] [address] [place] [phone]   [facility] [address] [place] or [address] [phone]   [facility] [address], [place] [phone], phone; [phone], fax Sees patients 1st and 3rd Saturday of every month.  Must not qualify for public or private insurance (i.e. Medicaid, Medicare, [place] Health Choice, Veterans' Benefits)  Household income should be no more than 200% of the poverty level The clinic cannot treat you if you are pregnant or think you are pregnant  Sexually transmitted diseases are not treated at the clinic.

## 2015-03-21 NOTE — ED Notes (Signed)
Patient transported to Ultrasound 

## 2015-03-21 NOTE — ED Notes (Signed)
Pt is in stable condition upon d/c and is escorted from ED via wheelchair. 

## 2015-03-21 NOTE — ED Notes (Signed)
Patient transported to X-ray 

## 2015-03-21 NOTE — ED Provider Notes (Signed)
CSN: [id number]     Arrival date & time 03/21/15  1051 History   First MD Initiated Contact with Patient 03/21/15 1054     No chief complaint on file.    (Consider location/radiation/quality/duration/timing/severity/associated sxs/prior Treatment) HPI 30 year old male who presents with worsening right leg swelling and pain. History of DVT that was diagnosed 2 weeks ago by his vascular physician. He has been compliant on Eliquis during this time, but is continuing to have progressively worsening swelling over his right ankle and foot. Denies any associating numbness or weakness, fevers or chills, nausea or vomiting, difficulty breathing, chest pain, orthopnea, or PND.    History reviewed. No pertinent past medical history. History reviewed. No pertinent past surgical history. History reviewed. No pertinent family history. Social History  Substance Use Topics  . Smoking status: Never Smoker   . Smokeless tobacco: None  . Alcohol Use: Yes    Review of Systems 10/14 systems reviewed and are negative other than those stated in the HPI    Allergies  Review of patient's allergies indicates no known allergies.  Home Medications   Prior to Admission medications   Medication Sig Start Date End Date Taking? Authorizing Provider  ELIQUIS 5 MG TABS tablet Take 5 mg by mouth 2 (two) times daily. 03/13/15  Yes Historical Provider, MD  naproxen (NAPROSYN) 500 MG tablet Take 1 tablet (500 mg total) by mouth 2 (two) times daily with a meal. 03/12/15 03/11/16 Yes Jami L Hagler, PA-C  oxyCODONE-acetaminophen (PERCOCET/ROXICET) 5-325 MG tablet Take 1 tablet by mouth every 6 (six) hours as needed. 03/13/15  Yes Historical Provider, MD  traMADol (ULTRAM) 50 MG tablet Take 1 tablet (50 mg total) by mouth every 6 (six) hours as needed for severe pain. 03/12/15 03/11/16 Yes Jami L Hagler, PA-C  diazepam (VALIUM) 2 MG tablet Take 1 tablet (2 mg total) by mouth every 8 (eight) hours as needed for  anxiety. Patient not taking: Reported on 03/21/2015 08/12/14 08/12/15  Chinita Pester, FNP  ibuprofen (ADVIL,MOTRIN) 800 MG tablet Take 1 tablet (800 mg total) by mouth every 8 (eight) hours as needed. Patient not taking: Reported on 03/21/2015 08/12/14   Chinita Pester, FNP   BP 116/79 mmHg  Pulse 64  Temp(Src) 98.3 F (36.8 C) (Oral)  Resp 20  Ht  (1.93 m)  Wt 270 lb (122.471 kg)  BMI 32.88 kg/m2  SpO2 99% Physical Exam Physical Exam  Nursing note and vitals reviewed. Constitutional: Well developed, well nourished, non-toxic, and in no acute distress Head: Normocephalic and atraumatic.  Mouth/Throat: Moist mucous membranes Neck: Neck supple.  Cardiovascular: Normal rate and regular rhythm.  +2 DP pulses bilaterally Pulmonary/Chest: Effort normal and breath sounds normal.  Abdominal: Soft. There is no tenderness. There is no rebound and no guarding.  Musculoskeletal: There is edema noted to dorsum of the right foot to the ankle, with mild erythema and warmth. No induration. No drainage.  Neurological: Alert, no facial droop, fluent speech, moves all extremities symmetrically, sensation to light touch in tact throughout bilateral lower extremities. Full strength ankle dorsi/plantar-flexion. Skin: Skin is warm and dry.  Psychiatric: Cooperative  ED Course  Procedures (including critical care time) Labs Review Labs Reviewed - No data to display  Imaging Review Dg Ankle Complete Right  03/21/2015  CLINICAL DATA:  Anterior ankle, no known injury, initial encounter EXAM: RIGHT ANKLE - COMPLETE 3+ VIEW COMPARISON:  04/15/2010 FINDINGS: Generalized soft tissue swelling is noted. No acute fracture or dislocation is seen.  IMPRESSION: Generalized soft tissue swelling. No acute bony abnormality is noted. No significant change from the prior exam. Electronically Signed   By: Alcide Clever M.D.   On: 03/21/2015 14:54   I have personally reviewed and evaluated these images and lab results as  part of my medical decision-making.    MDM   Final diagnoses:  Swelling of right lower extremity  History of DVT (deep vein thrombosis)    30 year old male with recently diagnosed right lower extremity DVT on Eliquis who presents with persistent swelling and pain in his right lower extremity. Well-appearing and in no acute distress on presentation. Without signs or symptoms of PE. On exam has a neurovascularly intact right lower extremity, with soft tissue edema noted to the dorsum of the right foot to the level of the ankle. Repeat ultrasound of his right lower extremity reveals no significant DVT. Has one documented ultrasound revealing no DVT on January 24, but patient states that he was diagnosed with DVT one week from that eye in his vascular surgeon's office. Discussed with Dr. Wyn Quaker, who is the patient's vascular surgeon from [place]. States that he had an acute DVT in the femoropopliteal region of his right lower extremity, with difficulty with pain control throughout this time. He offered him venogram with thrombolysis, but patient never followed up regarding this issue. Dr. Wyn Quaker reassured, and will continue to follow-up with patient as outpatient. No concern for intraabdominal/pelvic DVT at this time. I discussed continued supportive management as an outpatient with this patient. He is given PCP referral for workup of his DVT. He will continue to follow-up with Dr. dew as an outpatient.     Lavera Guise, MD 03/21/15 [phone]

## 2015-03-21 NOTE — ED Notes (Signed)
Pt presents with increased swelling, redness and pain to R foot and ankle x 2 weeks.  Pt denies an injury, reports he was diagnosed with DVT to same leg x 1 week ago, has been compliant with eliquis.

## 2015-03-26 ENCOUNTER — Ambulatory Visit: Payer: BLUE CROSS/BLUE SHIELD | Admitting: Sports Medicine

## 2015-03-26 ENCOUNTER — Other Ambulatory Visit: Payer: Self-pay | Admitting: Vascular Surgery

## 2015-04-01 ENCOUNTER — Encounter: Payer: Self-pay | Admitting: *Deleted

## 2015-04-01 ENCOUNTER — Ambulatory Visit
Admission: RE | Admit: 2015-04-01 | Discharge: 2015-04-01 | Disposition: A | Discharge status: Home / Self Care | Payer: BLUE CROSS/BLUE SHIELD | Source: Ambulatory Visit | Attending: Vascular Surgery | Admitting: Vascular Surgery

## 2015-04-01 ENCOUNTER — Encounter
Admission: RE | Disposition: A | Discharge status: Home / Self Care | Payer: Self-pay | Source: Ambulatory Visit | Attending: Vascular Surgery

## 2015-04-01 DIAGNOSIS — Z79899 Other long term (current) drug therapy: Secondary | ICD-10-CM | POA: Insufficient documentation

## 2015-04-01 DIAGNOSIS — Z86718 Personal history of other venous thrombosis and embolism: Secondary | ICD-10-CM | POA: Diagnosis present

## 2015-04-01 DIAGNOSIS — M7989 Other specified soft tissue disorders: Secondary | ICD-10-CM | POA: Diagnosis not present

## 2015-04-01 DIAGNOSIS — M79609 Pain in unspecified limb: Secondary | ICD-10-CM | POA: Diagnosis not present

## 2015-04-01 HISTORY — DX: Acute embolism and thrombosis of unspecified deep veins of unspecified lower extremity: I82.409

## 2015-04-01 HISTORY — PX: PERIPHERAL VASCULAR CATHETERIZATION: SHX172C

## 2015-04-01 LAB — BUN: BUN: 11 mg/dL (ref 6–20)

## 2015-04-01 LAB — CREATININE, SERUM
CREATININE: 1.33 mg/dL — AB (ref 0.61–1.24)
GFR calc Af Amer: >60 mL/min (ref >60)

## 2015-04-01 SURGERY — LOWER EXTREMITY VENOGRAPHY
Laterality: Right

## 2015-04-01 MED ORDER — DEXTROSE 5 % IV SOLN
1.5000 g | INTRAVENOUS | Status: AC
  Administered 2015-04-01: 1.5 g via INTRAVENOUS

## 2015-04-01 MED ORDER — IOHEXOL 300 MG/ML  SOLN
INTRAMUSCULAR | Status: DC | PRN
  Administered 2015-04-01: 17 mL via INTRAVENOUS

## 2015-04-01 MED ORDER — MIDAZOLAM HCL 2 MG/2ML IJ SOLN
INTRAMUSCULAR | Status: DC | PRN
  Administered 2015-04-01: 1 mg via INTRAVENOUS
  Administered 2015-04-01: 2 mg via INTRAVENOUS

## 2015-04-01 MED ORDER — HEPARIN (PORCINE) IN NACL 2-0.9 UNIT/ML-% IJ SOLN
INTRAMUSCULAR | Status: AC
Start: 2015-04-01 — End: 2015-04-01
  Filled 2015-04-01: qty 1000

## 2015-04-01 MED ORDER — FENTANYL CITRATE (PF) 100 MCG/2ML IJ SOLN
INTRAMUSCULAR | Status: AC
  Filled 2015-04-01: qty 2

## 2015-04-01 MED ORDER — ONDANSETRON HCL 4 MG/2ML IJ SOLN: 4.0000 mg | Freq: Four times a day (QID) | INTRAMUSCULAR | Status: DC | PRN

## 2015-04-01 MED ORDER — FENTANYL CITRATE (PF) 100 MCG/2ML IJ SOLN
INTRAMUSCULAR | Status: DC | PRN
  Administered 2015-04-01: 50 ug via INTRAVENOUS
  Administered 2015-04-01: 25 ug via INTRAVENOUS

## 2015-04-01 MED ORDER — MIDAZOLAM HCL 5 MG/5ML IJ SOLN
INTRAMUSCULAR | Status: AC
  Filled 2015-04-01: qty 5

## 2015-04-01 MED ORDER — FAMOTIDINE 20 MG PO TABS: 40.0000 mg | ORAL_TABLET | ORAL | Status: DC | PRN

## 2015-04-01 MED ORDER — METHYLPREDNISOLONE SODIUM SUCC 125 MG IJ SOLR
125.0000 mg | INTRAMUSCULAR | Status: DC | PRN
Start: 2015-04-01 — End: 2015-04-01

## 2015-04-01 MED ORDER — SODIUM CHLORIDE 0.9 % IV SOLN
INTRAVENOUS | Status: DC
  Administered 2015-04-01: 12:00:00 via INTRAVENOUS

## 2015-04-01 MED ORDER — HEPARIN SODIUM (PORCINE) 1000 UNIT/ML IJ SOLN
INTRAMUSCULAR | Status: AC
  Filled 2015-04-01: qty 1

## 2015-04-01 MED ORDER — HYDROMORPHONE HCL 1 MG/ML IJ SOLN: 1.0000 mg | Freq: Once | INTRAMUSCULAR | Status: DC

## 2015-04-01 SURGICAL SUPPLY — 8 items
CANNULA 5F STIFF (CANNULA) ×5 IMPLANT
CATH KA2 5FR 65CM (CATHETERS) ×5 IMPLANT
GLIDECATH 4FR STR (CATHETERS) ×5 IMPLANT
PACK ANGIOGRAPHY (CUSTOM PROCEDURE TRAY) ×5 IMPLANT
SHEATH BRITE TIP 5FRX11 (SHEATH) ×5 IMPLANT
SYR MEDRAD MARK V 150ML (SYRINGE) ×5 IMPLANT
TUBING CONTRAST HIGH PRESS 72 (TUBING) ×5 IMPLANT
WIRE MAGIC TOR.035 180C (WIRE) ×5 IMPLANT

## 2015-04-01 NOTE — H&P (Signed)
  [facility] History & Physical Update  The patient was interviewed and re-examined.  The patient's previous History and Physical has been reviewed and is unchanged.  There is no change in the plan of care. We plan to proceed with the scheduled procedure.  Phu Record, MD  04/01/2015, 11:48 AM

## 2015-04-01 NOTE — Discharge Instructions (Signed)
Venogram, Care After °Refer to this sheet in the next few weeks. These instructions provide you with information on caring for yourself after your procedure. Your health care provider may also give you more specific instructions. Your treatment has been planned according to current medical practices, but problems sometimes occur. Call your health care provider if you have any problems or questions after your procedure. °WHAT TO EXPECT AFTER THE PROCEDURE °After your procedure, it is typical to have the following sensations: °· Mild discomfort at the catheter insertion site. °HOME CARE INSTRUCTIONS  °· Take all medicines exactly as directed. °· Follow any prescribed diet. °· Follow instructions regarding both rest and physical activity. °· Drink more fluids for the first several days after the procedure in order to help flush dye from your kidneys. °SEEK MEDICAL CARE IF: °· You develop a rash. °· You have fever not controlled by medicine. °SEEK IMMEDIATE MEDICAL CARE IF: °· There is pain, drainage, bleeding, redness, swelling, warmth or a red streak at the site of the IV tube. °· The extremity where your IV tube was placed becomes discolored, numb, or cool. °· You have difficulty breathing or shortness of breath. °· You develop chest pain. °· You have excessive dizziness or fainting. °  °This information is not intended to replace advice given to you by your health care provider. Make sure you discuss any questions you have with your health care provider. °  °Document Released: 11/23/2012 Document Revised: 02/07/2013 Document Reviewed: 11/23/2012 °Elsevier Interactive Patient Education ©2016 [employer] ° °

## 2015-04-01 NOTE — Op Note (Signed)
[facility]   OPERATIVE NOTE   PRE-OPERATIVE DIAGNOSIS: Right lower extremity DVT  POST-OPERATIVE DIAGNOSIS: same   PROCEDURE: 1. US guidance for vascular access to  right lesser saphenous vein 2. Catheter placement into right iliac vein from right lesser saphenous vein approach 3. IVC gram and right lower extremity venogram    SURGEON: Shirley Bolle, MD  ASSISTANT(S): none  ANESTHESIA: local with moderate conscious sedation for 20 minutes using 3 mg of Versed and 75 g of fentanyl  ESTIMATED BLOOD LOSS: Minimal  CONTRAST: 17 cc  FLUORO TIME: 0.7 minutes  FINDING(S): 1. No popliteal, femoral, or iliac vein DVT identified. Reflux is seen within the popliteal vein which would suggest previous DVT which has resolved  SPECIMEN(S): none  INDICATIONS:  Patient is a 30 y.o. male who presents with relatively recent diagnosis of DVT. Although his pain and swelling persist, his most recent ultrasound had suggested improvement if not complete resolution of his DVT. Patient has marked leg swelling and pain. Venous imaging is performed to evaluate if a clot still present or if there has been proximal propagation that is not detected on ultrasound.   DESCRIPTION: After obtaining full informed written consent, the patient was brought back to the vascular suite and placed supine upon the table. The patient received IV antibiotics prior to induction. After obtaining adequate anesthesia, the patient was prepped and draped in the standard fashion. The patient was monitored the entire time with pulse oximetry, telemetry, and mental status evaluation with a registered nurse under my supervision administering the medications and a face-to-face evaluation throughout the procedure with me. The patient was then placed into the prone position. The right lesser saphenous vein was then accessed under direct ultrasound guidance without difficulty with a micropuncture  needle and a permanent image was recorded. I then began with imaging through the micropuncture sheath. To fully evaluate the inferior vena cava and iliac veins a later exchanged for a 4 French glide catheter parked in the distal external iliac vein just above the femoral head. No popliteal, femoral, or iliac vein DVT identified. Reflux is seen within the popliteal vein which would suggest previous DVT which has resolved. There was no role for intervention at this point. I then elected to terminate the procedure. The sheath was removed and a dressing was placed. She was taken to the recovery room in stable condition having tolerated the procedure well.   COMPLICATIONS: None  CONDITION: Stable  Stuart Mclaughlin 04/01/2015 2:29 PM

## 2015-04-02 ENCOUNTER — Encounter: Payer: Self-pay | Admitting: Vascular Surgery

## 2015-08-30 ENCOUNTER — Encounter: Payer: Self-pay | Admitting: Emergency Medicine

## 2015-08-30 ENCOUNTER — Emergency Department
Admission: EM | Admit: 2015-08-30 | Discharge: 2015-08-30 | Disposition: A | Discharge status: Home / Self Care | Payer: BLUE CROSS/BLUE SHIELD | Attending: Emergency Medicine | Admitting: Emergency Medicine

## 2015-08-30 ENCOUNTER — Emergency Department: Payer: BLUE CROSS/BLUE SHIELD

## 2015-08-30 DIAGNOSIS — Z86718 Personal history of other venous thrombosis and embolism: Secondary | ICD-10-CM | POA: Insufficient documentation

## 2015-08-30 DIAGNOSIS — M25562 Pain in left knee: Secondary | ICD-10-CM | POA: Diagnosis not present

## 2015-08-30 LAB — SYNOVIAL CELL COUNT + DIFF, W/ CRYSTALS
CRYSTALS FLUID: NONE SEEN
EOSINOPHILS-SYNOVIAL: 0 %
Lymphocytes-Synovial Fld: 12 %
MONOCYTE-MACROPHAGE-SYNOVIAL FLUID: 84 %
NEUTROPHIL, SYNOVIAL: 4 %
WBC, SYNOVIAL: 278 /mm3 — AB (ref 0–200)

## 2015-08-30 MED ORDER — OXYCODONE-ACETAMINOPHEN 5-325 MG PO TABS: 1.0000 | ORAL_TABLET | Freq: Four times a day (QID) | ORAL | Status: DC | PRN

## 2015-08-30 MED ORDER — LIDOCAINE-EPINEPHRINE (PF) 1 %-1:200000 IJ SOLN
INTRAMUSCULAR | Status: AC
  Administered 2015-08-30: 11:00:00
  Filled 2015-08-30: qty 30

## 2015-08-30 NOTE — ED Notes (Signed)
Synovial fluid sample walked to lab

## 2015-08-30 NOTE — ED Notes (Signed)
Patient transported to Ultrasound 

## 2015-08-30 NOTE — ED Notes (Signed)
Pain/ Swelling to left knee. ambulates well.

## 2015-08-30 NOTE — ED Notes (Signed)
Report to Ashley, RN

## 2015-08-30 NOTE — ED Notes (Signed)
Left knee and anterior swelling X 1 week. Pt hx of blood clot in right leg, states pain is similar. PT denies injury. No redness. Tenderness upon palpation. Pt alert and oriented X4, active, cooperative, pt in NAD. RR even and unlabored, color WNL.

## 2015-08-30 NOTE — ED Provider Notes (Signed)
[facility] Emergency Department Provider Note  Time seen: 10:50 AM  I have reviewed the triage vital signs and the nursing notes.   HISTORY  Chief Complaint Knee Pain    HPI Stuart Mclaughlin is a 30 y.o. male with a past medical history of DVT who presents the emergency department with 1 week of left knee pain. According to the patient he denies any injury, states he went to bed one day and awoke with left knee pain approximate 5 days ago. States the pain is worse with movement. Has a history of a blood clot in the past, states somewhat similar presentation but states at that time the pain was more in his leg than his knee.Patient describes the pain as moderate dull pain. Denies any history of gout. Denies any injuries to the knee. Denies any fever, nausea or vomiting.     Past Medical History  Diagnosis Date  . Deep vein thrombosis (DVT) (HCC)     There are no active problems to display for this patient.   Past Surgical History  Procedure Laterality Date  . Peripheral vascular catheterization  04/01/2015    Procedure: Lower Extremity Venography;  Surgeon: Annice NeedyJason S Dew, MD;  Location: [facility] INVASIVE CV LAB;  Service: Cardiovascular;;  . Peripheral vascular catheterization Right 04/01/2015    Procedure: Lower Extremity Intervention;  Surgeon: Annice NeedyJason S Dew, MD;  Location: [facility] INVASIVE CV LAB;  Service: Cardiovascular;  Laterality: Right;  . Peripheral vascular catheterization N/A 04/01/2015    Procedure: IVC Filter Insertion;  Surgeon: Annice NeedyJason S Dew, MD;  Location: [facility] INVASIVE CV LAB;  Service: Cardiovascular;  Laterality: N/A;    Current Outpatient Rx  Name  Route  Sig  Dispense  Refill  . ELIQUIS 5 MG TABS tablet   Oral   Take 5 mg by mouth 2 (two) times daily.      0     Dispense as written.   . naproxen (NAPROSYN) 500 MG tablet   Oral   Take 1 tablet (500 mg total) by mouth 2 (two) times daily with a meal.   28 tablet   0   .  oxyCODONE-acetaminophen (PERCOCET/ROXICET) 5-325 MG tablet   Oral   Take 1 tablet by mouth every 6 (six) hours as needed.      0   . traMADol (ULTRAM) 50 MG tablet   Oral   Take 1 tablet (50 mg total) by mouth every 6 (six) hours as needed for severe pain.   10 tablet   0     Allergies Review of patient's allergies indicates no known allergies.  No family history on file.  Social History Social History  Substance Use Topics  . Smoking status: Never Smoker   . Smokeless tobacco: None  . Alcohol Use: 1.2 oz/week    2 Cans of beer per week    Review of Systems Constitutional: Negative for fever. Cardiovascular: Negative for chest pain. Respiratory: Negative for shortness of breath. Gastrointestinal: Negative for abdominal pain Musculoskeletal: Left knee pain. Neurological: Negative for headache 10-point ROS otherwise negative.  ____________________________________________   PHYSICAL EXAM:  VITAL SIGNS: ED Triage Vitals  Enc Vitals Group     BP 08/30/15 0923 142/105 mmHg     Pulse Rate 08/30/15 0923 61     Resp 08/30/15 0923 18     Temp 08/30/15 0923 97.7 F (36.5 C)     Temp Source 08/30/15 0923 Oral     SpO2 08/30/15 0923 97 %  Weight 08/30/15 0923 260 lb (117.935 kg)     Height 08/30/15 0923  (1.93 m)     Head Cir --      Peak Flow --      Pain Score 08/30/15 0924 7     Pain Loc --      Pain Edu? --      Excl. in GC? --     Constitutional: Alert and oriented. Well appearing and in no distress. Eyes: Normal exam ENT   Head: Normocephalic and atraumatic   Mouth/Throat: Mucous membranes are moist. Cardiovascular: Normal rate, regular rhythm. No murmur Respiratory: Normal respiratory effort without tachypnea nor retractions. Breath sounds are clear  Gastrointestinal: Soft and nontender. No distention.   Musculoskeletal: Mild tenderness palpation of the left knee, mild joint effusion palpated. Moderate warmth to the knee, no erythema. No  calf tenderness. No lower extremity edema. Neurologic:  Normal speech and language. No gross focal neurologic deficits  Skin:  Skin is warm, dry and intact.  Psychiatric: Mood and affect are normal.   ____________________________________________      RADIOLOGY  Ultrasound negative.  ____________________________________________   INITIAL IMPRESSION / ASSESSMENT AND PLAN / ED COURSE  Pertinent labs & imaging results that were available during my care of the patient were reviewed by me and considered in my medical decision making (see chart for details).  The patient presents the emergency department left knee pain for the past 5 days. Denies any injury. Patient does have mild swelling to the left knee. No erythema. I discussed with the patient present on joint aspiration, he is agreeable. Joint aspiration performed by myself without complication under sterile technique.  Apiration of blood/fluid Performed by: Minna Antis Consent obtained. Required items: required blood products, implants, devices, and special equipment available Patient identity confirmed: verbally with patient Preparation: Patient was prepped in the usual sterile fashion. Patient tolerance: Patient tolerated the procedure well with no immediate complications.  Location of aspiration: Left knee   Synovial fluid has resulted showing 270 white blood cells, does not appear to be consistent with inflammatory arthritis or infectious etiology. Negative for crystals. We'll place the patient on pain medication and have him follow-up with orthopedics. Patient is agreeable to this plan.     ____________________________________________   FINAL CLINICAL IMPRESSION(S) / ED DIAGNOSES  Knee pain   Minna Antis, MD 08/30/15 1303

## 2015-08-30 NOTE — Discharge Instructions (Signed)

## 2015-09-03 LAB — BODY FLUID CULTURE: Culture: NO GROWTH

## 2015-11-21 ENCOUNTER — Emergency Department
Admission: EM | Admit: 2015-11-21 | Discharge: 2015-11-21 | Disposition: A | Discharge status: Home / Self Care | Payer: BLUE CROSS/BLUE SHIELD | Attending: Emergency Medicine | Admitting: Emergency Medicine

## 2015-11-21 ENCOUNTER — Encounter: Payer: Self-pay | Admitting: Emergency Medicine

## 2015-11-21 ENCOUNTER — Emergency Department: Payer: BLUE CROSS/BLUE SHIELD

## 2015-11-21 DIAGNOSIS — Z79899 Other long term (current) drug therapy: Secondary | ICD-10-CM | POA: Diagnosis not present

## 2015-11-21 DIAGNOSIS — M25462 Effusion, left knee: Secondary | ICD-10-CM | POA: Insufficient documentation

## 2015-11-21 DIAGNOSIS — M25562 Pain in left knee: Secondary | ICD-10-CM | POA: Diagnosis present

## 2015-11-21 MED ORDER — TRAMADOL HCL 50 MG PO TABS: 50.0000 mg | ORAL_TABLET | Freq: Four times a day (QID) | ORAL | 0 refills | Status: AC | PRN

## 2015-11-21 NOTE — ED Provider Notes (Signed)
[facility] Emergency Department Provider Note   ____________________________________________   None    (approximate)  I have reviewed the triage vital signs and the nursing notes.   HISTORY  Chief Complaint Knee Pain    HPI Stuart Mclaughlin is a 30 y.o. male presents with complaints of left knee pain 3 weeks. Denies any known injury. Describes pain as 6/10 at this time nonradiating. Patient has a past medical history of knee pain and DVTs. States this pain is more anterior on the kneecap itself. As time patient was here he had I&D of his effusion. Patient was last seen here in July and has never followed up with orthopedics as instructed.   Past Medical History:  Diagnosis Date  . Deep vein thrombosis (DVT) (HCC)     There are no active problems to display for this patient.   Past Surgical History:  Procedure Laterality Date  . PERIPHERAL VASCULAR CATHETERIZATION  04/01/2015   Procedure: Lower Extremity Venography;  Surgeon: Annice Needy, MD;  Location: [facility] INVASIVE CV LAB;  Service: Cardiovascular;;  . PERIPHERAL VASCULAR CATHETERIZATION Right 04/01/2015   Procedure: Lower Extremity Intervention;  Surgeon: Annice Needy, MD;  Location: [facility] INVASIVE CV LAB;  Service: Cardiovascular;  Laterality: Right;  . PERIPHERAL VASCULAR CATHETERIZATION N/A 04/01/2015   Procedure: IVC Filter Insertion;  Surgeon: Annice Needy, MD;  Location: [facility] INVASIVE CV LAB;  Service: Cardiovascular;  Laterality: N/A;    Prior to Admission medications   Medication Sig Start Date End Date Taking? Authorizing Provider  ELIQUIS 5 MG TABS tablet Take 5 mg by mouth 2 (two) times daily. 03/13/15   Historical Provider, MD  traMADol (ULTRAM) 50 MG tablet Take 1 tablet (50 mg total) by mouth every 6 (six) hours as needed. 11/21/15 11/20/16  Evangeline Dakin, PA-C    Allergies Review of patient's allergies indicates no known allergies.  No family history on file.  Social  History Social History  Substance Use Topics  . Smoking status: Never Smoker  . Smokeless tobacco: Never Used  . Alcohol use 1.2 oz/week    2 Cans of beer per week    Review of Systems Constitutional: No fever/chills Eyes: No visual changes. ENT: No sore throat. Cardiovascular: Denies chest pain. Respiratory: Denies shortness of breath. Gastrointestinal: No abdominal pain.  No nausea, no vomiting.  No diarrhea.  No constipation. Genitourinary: Negative for dysuria. Musculoskeletal: Positive for left knee pain. Skin: Negative for rash. Neurological: Negative for headaches, focal weakness or numbness.  10-point ROS otherwise negative.  ____________________________________________   PHYSICAL EXAM:  VITAL SIGNS: ED Triage Vitals  Enc Vitals Group     BP 11/21/15 1039 125/88     Pulse Rate 11/21/15 1039 83     Resp 11/21/15 1039 18     Temp 11/21/15 1039 97.5 F (36.4 C)     Temp Source 11/21/15 1039 Oral     SpO2 11/21/15 1039 96 %     Weight 11/21/15 1039 270 lb (122.5 kg)     Height 11/21/15 1039 6\' 4"  (1.93 m)     Head Circumference --      Peak Flow --      Pain Score 11/21/15 1038 6     Pain Loc --      Pain Edu? --      Excl. in GC? --     Constitutional: Alert and oriented. Well appearing and in no acute distress. Eyes: Conjunctivae are normal. PERRL. EOMI. Head:  Atraumatic. Nose: No congestion/rhinnorhea. Mouth/Throat: Mucous membranes are moist.  Oropharynx non-erythematous. Neck: No stridor.   Cardiovascular: Normal rate, regular rhythm. Grossly normal heart sounds.  Good peripheral circulation. Respiratory: Normal respiratory effort.  No retractions. Lungs CTAB. Gastrointestinal: Soft and nontender. No distention. No abdominal bruits. No CVA tenderness. Musculoskeletal: No lower extremity tenderness nor edema.  No joint effusions. Neurologic:  Normal speech and language. No gross focal neurologic deficits are appreciated. No gait instability. Skin:   Skin is warm, dry and intact. No rash noted. Psychiatric: Mood and affect are normal. Speech and behavior are normal.  ____________________________________________   LABS (all labs ordered are listed, but only abnormal results are displayed)  Labs Reviewed - No data to display ____________________________________________  EKG   ____________________________________________  RADIOLOGY  IMPRESSION:  Mild osteoarthritic changes of the left knee. Calcification of the  origin of the patellar tendon. Suprapatellar effusion.    If the patient's symptoms persist, MRI would be a useful next  imaging step.    ____________________________________________   PROCEDURES  Procedure(s) performed: None  Procedures  Critical Care performed: No  ____________________________________________   INITIAL IMPRESSION / ASSESSMENT AND PLAN / ED COURSE  Pertinent labs & imaging results that were available during my care of the patient were reviewed by me and considered in my medical decision making (see chart for details).  Recurrent left knee pain. Suprapatellar effusion. Patient was given prescription for tramadol every 6 hours since patient is on Eliquis. Encourage rest and elevation and follow-up with orthopedics for further evaluation and possible MRI.  Clinical Course     ____________________________________________   FINAL CLINICAL IMPRESSION(S) / ED DIAGNOSES  Final diagnoses:  Effusion of left knee      NEW MEDICATIONS STARTED DURING THIS VISIT:  New Prescriptions   TRAMADOL (ULTRAM) 50 MG TABLET    Take 1 tablet (50 mg total) by mouth every 6 (six) hours as needed.     Note:  This document was prepared using Dragon voice recognition software and may include unintentional dictation errors.   Evangeline Dakinharles M Mariane Burpee, PA-C 11/21/15 1318    Jeanmarie PlantJames A McShane, MD 11/21/15 [phone]

## 2015-11-21 NOTE — ED Triage Notes (Signed)
Pt to ed with c/o left knee pain x 3 weeks.  Denies injury.

## 2015-11-21 NOTE — ED Notes (Signed)
Patient is complaining of left knee pain x 3 weeks.  History of needing fluid drained from knee.  Patient denies injury to knee.

## 2016-11-14 IMAGING — DX DG ANKLE COMPLETE 3+V*R*
3 series · 3 of 3 positions shown · non-contrast
Comparison: 04/15/2010

CLINICAL DATA: Anterior ankle, no known injury, initial encounter

EXAM:
RIGHT ANKLE - COMPLETE 3+ VIEW

[ankle ap]
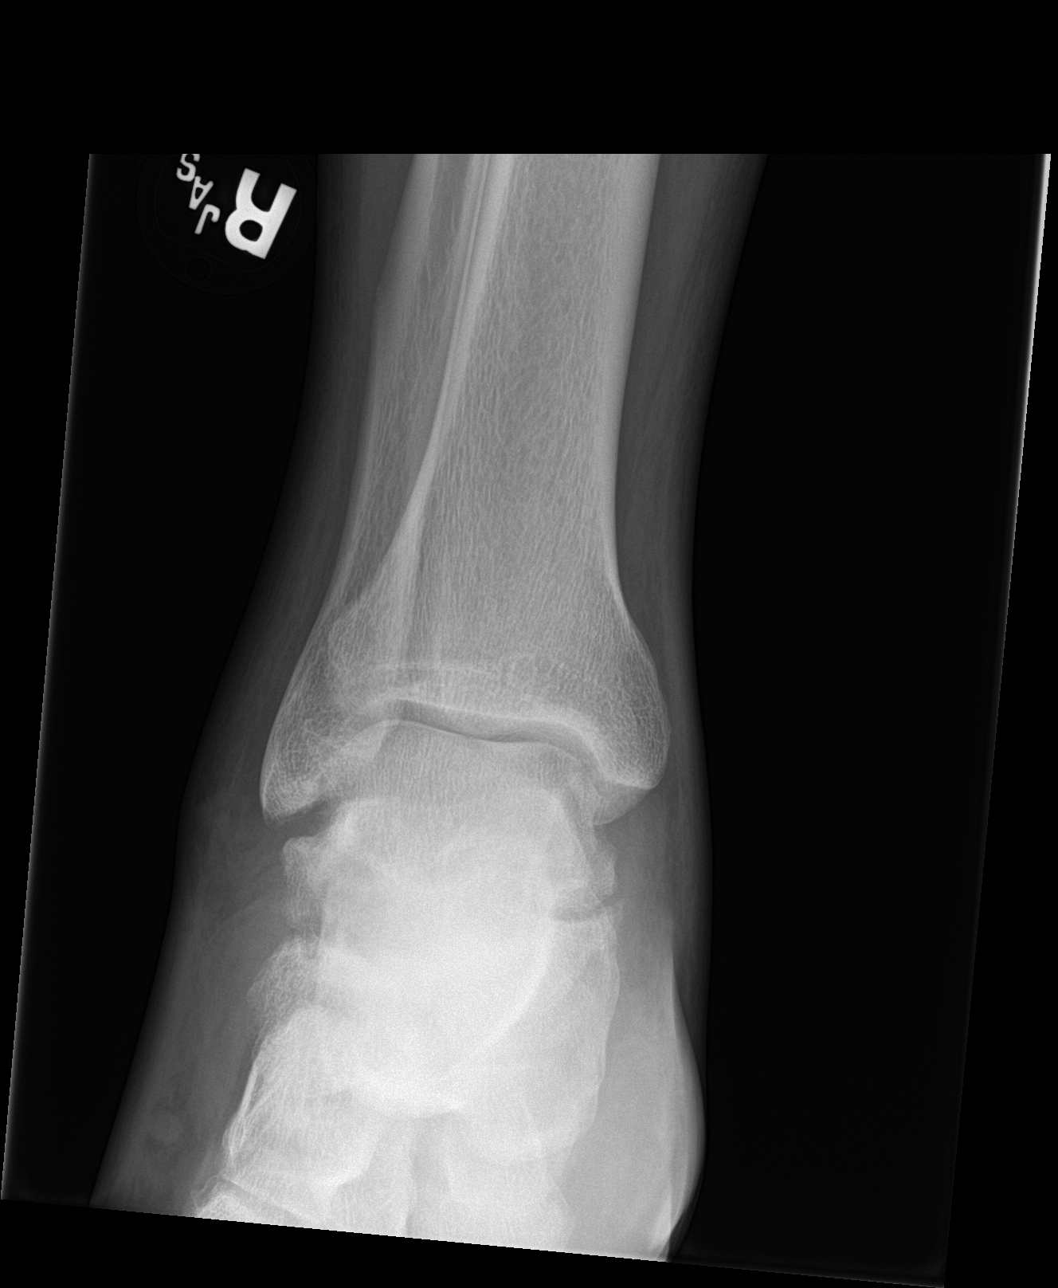

[ankle obl]
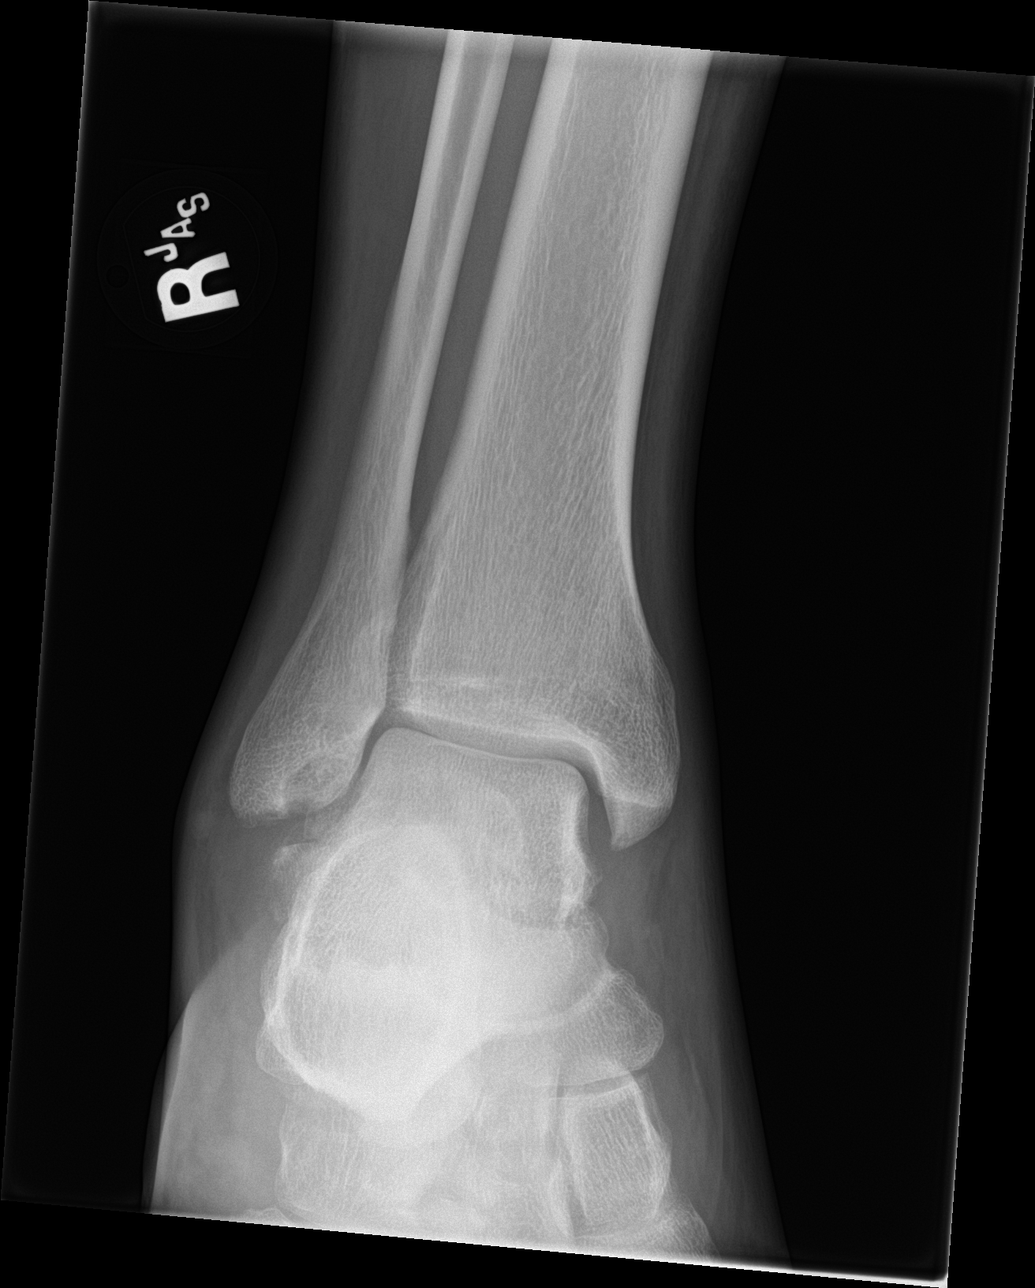

[ankle lat]
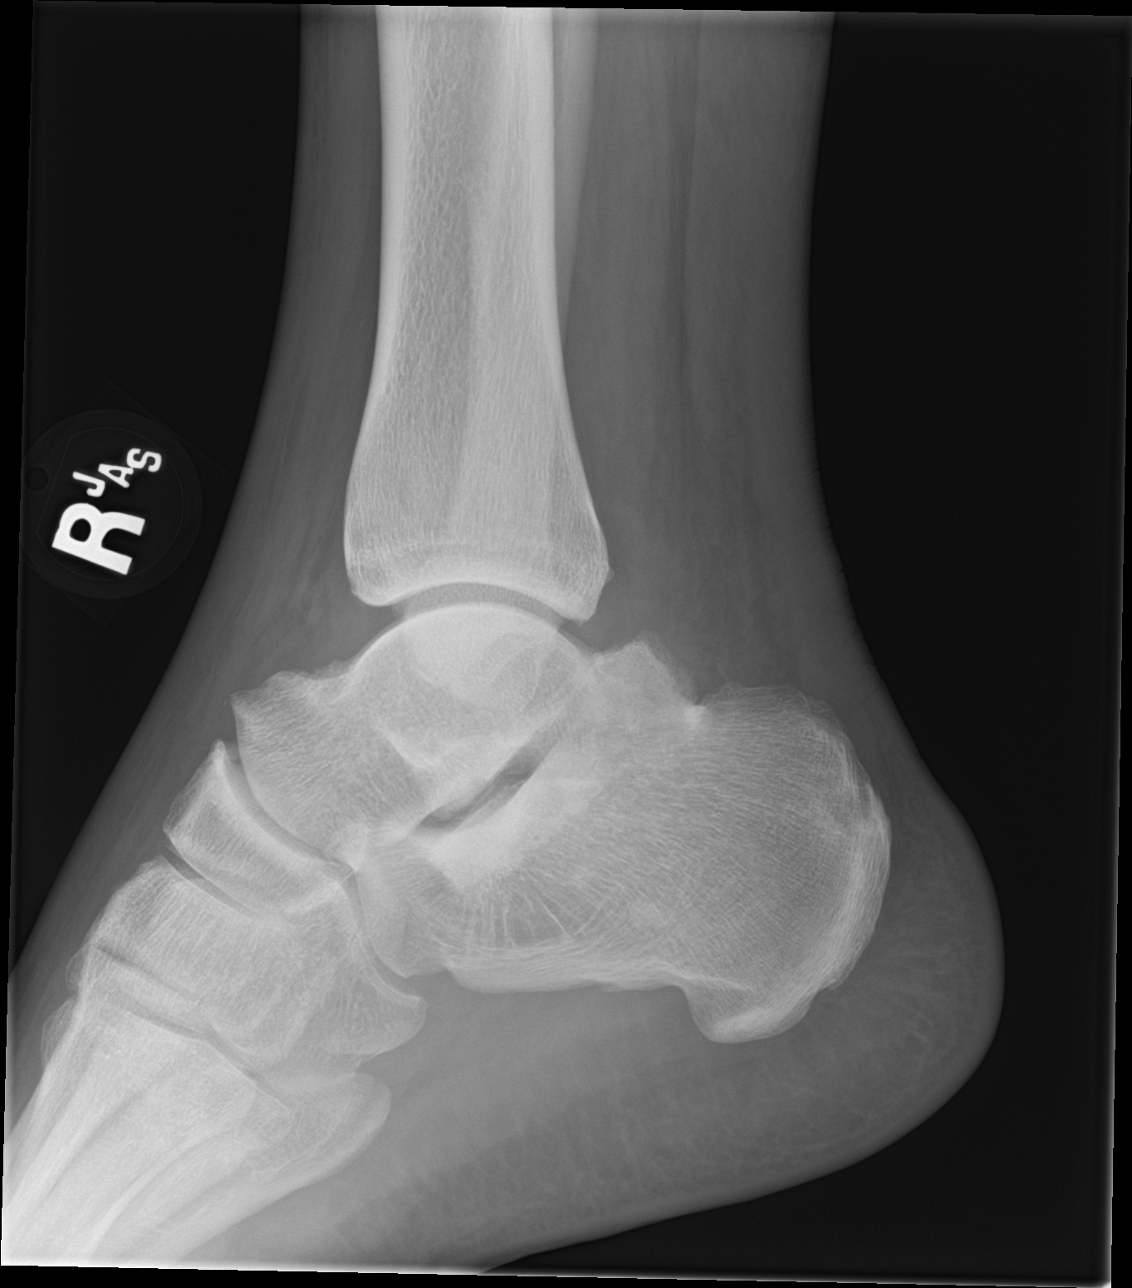

[3 of 3 positions shown; findings below may reference images not displayed]

FINDINGS: Generalized soft tissue swelling is noted. No acute fracture or
dislocation is seen.
IMPRESSION: Generalized soft tissue swelling. No acute bony abnormality is
noted. No significant change from the prior exam.

## 2017-08-25 ENCOUNTER — Emergency Department: Payer: BLUE CROSS/BLUE SHIELD

## 2017-08-25 ENCOUNTER — Encounter: Payer: Self-pay | Admitting: Emergency Medicine

## 2017-08-25 ENCOUNTER — Other Ambulatory Visit: Payer: Self-pay

## 2017-08-25 ENCOUNTER — Emergency Department
Admission: EM | Admit: 2017-08-25 | Discharge: 2017-08-25 | Disposition: A | Discharge status: Home / Self Care | Payer: BLUE CROSS/BLUE SHIELD | Attending: Emergency Medicine | Admitting: Emergency Medicine

## 2017-08-25 DIAGNOSIS — M79675 Pain in left toe(s): Secondary | ICD-10-CM | POA: Diagnosis not present

## 2017-08-25 DIAGNOSIS — M21612 Bunion of left foot: Secondary | ICD-10-CM | POA: Diagnosis not present

## 2017-08-25 DIAGNOSIS — Z86718 Personal history of other venous thrombosis and embolism: Secondary | ICD-10-CM | POA: Diagnosis not present

## 2017-08-25 DIAGNOSIS — M21619 Bunion of unspecified foot: Secondary | ICD-10-CM

## 2017-08-25 DIAGNOSIS — M19071 Primary osteoarthritis, right ankle and foot: Secondary | ICD-10-CM | POA: Diagnosis not present

## 2017-08-25 DIAGNOSIS — M19072 Primary osteoarthritis, left ankle and foot: Secondary | ICD-10-CM | POA: Diagnosis not present

## 2017-08-25 MED ORDER — KETOROLAC TROMETHAMINE 30 MG/ML IJ SOLN
30.0000 mg | Freq: Once | INTRAMUSCULAR | Status: AC
  Administered 2017-08-25: 30 mg via INTRAMUSCULAR
  Filled 2017-08-25: qty 1

## 2017-08-25 MED ORDER — NAPROXEN 500 MG PO TABS: 500.0000 mg | ORAL_TABLET | Freq: Two times a day (BID) | ORAL | 0 refills | Status: DC

## 2017-08-25 NOTE — ED Notes (Signed)
Pt back in room from XR 

## 2017-08-25 NOTE — ED Provider Notes (Signed)
[facility] Emergency Department Provider Note  ____________________________________________  Time seen: Approximately 3:23 PM  I have reviewed the triage vital signs and the nursing notes.   HISTORY  Chief Complaint Toe Pain    HPI Stuart Mclaughlin is a 32 y.o. male that presents emergency department for evaluation of left great toe pain for 2 days.  Pain is on the outside of the base of his great toe.  He can move his toe without difficulty.  Pain is worse when he presses on the side of his foot.  No history of gout.  No trauma.  No numbness, tingling.   Past Medical History:  Diagnosis Date  . Deep vein thrombosis (DVT) (HCC)     There are no active problems to display for this patient.   Past Surgical History:  Procedure Laterality Date  . PERIPHERAL VASCULAR CATHETERIZATION  04/01/2015   Procedure: Lower Extremity Venography;  Surgeon: Annice NeedyJason S Dew, MD;  Location: [facility] INVASIVE CV [facility];  Service: Cardiovascular;;  . PERIPHERAL VASCULAR CATHETERIZATION Right 04/01/2015   Procedure: Lower Extremity Intervention;  Surgeon: Annice NeedyJason S Dew, MD;  Location: [facility] INVAS[facility] CV [facility];  Service: Cardiovascular;  Laterality: Right;  . PERIPHERAL VASCULAR CATHETERIZATION N/A 04/01/2015   Procedure: IVC Filter Insertion;  Surgeon: Annice NeedyJason S Dew, MD;  Location: [facility] CV [facility];  Service: Cardiovascular;  Laterality: N/A;    Prior to Admission medications   Medication Sig Start Date End Date Taking? Authorizing Provider  naproxen (NAPROSYN) 500 MG tablet Take 1 tablet (500 mg total) by mouth 2 (two) times daily with a meal. 08/25/17 08/25/18  Enid DerryWagner, Christopherjame Carnell, PA-C    Allergies Patient has no known allergies.  No family history on file.  Social History Social History   Tobacco Use  . Smoking status: Never Smoker  . Smokeless tobacco: Never Used  Substance Use Topics  . Alcohol use: Yes    Alcohol/week: 1.2 oz    Types: 2 Cans of beer per week  . Drug use: No      Review of Systems  Constitutional: No fever/chills Respiratory:  No SOB. Gastrointestinal: No abdominal pain.  No nausea, no vomiting.  Musculoskeletal: Positive for foot pain. Skin: Negative for rash, abrasions, lacerations, ecchymosis. Neurological: Negative for numbness or tingling   ____________________________________________   PHYSICAL EXAM:  VITAL SIGNS: ED Triage Vitals  Enc Vitals Group     BP 08/25/17 1255 (!) 139/100     Pulse Rate 08/25/17 1255 60     Resp 08/25/17 1255 18     Temp 08/25/17 1255 98.3 F (36.8 C)     Temp Source 08/25/17 1255 Oral     SpO2 08/25/17 1255 98 %     Weight 08/25/17 1256 275 lb (124.7 kg)     Height 08/25/17 1256 6\' 4"  (1.93 m)     Head Circumference --      Peak Flow --      Pain Score 08/25/17 1255 8     Pain Loc --      Pain Edu? --      Excl. in GC? --      Constitutional: Alert and oriented. Well appearing and in no acute distress. Eyes: Conjunctivae are normal. PERRL. EOMI. Head: Atraumatic. ENT:      Ears:      Nose: No congestion/rhinnorhea.      Mouth/Throat: Mucous membranes are moist.  Neck: No stridor.  Cardiovascular: Normal rate, regular rhythm.  Good peripheral circulation. Respiratory: Normal respiratory  effort without tachypnea or retractions. Lungs CTAB. Good air entry to the bases with no decreased or absent breath sounds. Musculoskeletal: Full range of motion to all extremities. No gross deformities appreciated.  Tenderness to palpation over lateral distal first metatarsal.  Full range of motion of toe.  No warmth erythema. Neurologic:  Normal speech and language. No gross focal neurologic deficits are appreciated.  Skin:  Skin is warm, dry and intact. No rash noted. Psychiatric: Mood and affect are normal. Speech and behavior are normal. Patient exhibits appropriate insight and judgement.   ____________________________________________   LABS (all labs ordered are listed, but only abnormal  results are displayed)  Labs Reviewed - No data to display ____________________________________________  EKG   ____________________________________________  RADIOLOGY Lexine Baton, personally viewed and evaluated these images (plain radiographs) as part of my medical decision making, as well as reviewing the written report by the radiologist.  Dg Foot Complete Left  Result Date: 08/25/2017 CLINICAL DATA:  Left great toe pain.  No known trauma. EXAM: LEFT FOOT - COMPLETE 3+ VIEW COMPARISON:  None. FINDINGS: There is slight hallux valgus deformity. Slight bunion formation on the head of the first metatarsal. First MTP joint space is preserved. No discrete erosions. Slight dorsal spurring at the talonavicular joint. Tiny old avulsion from the tip of the medial malleolus. IMPRESSION: No acute abnormalities. Slight degenerative changes of the first MTP joint. No radiographic evidence of gout. Electronically Signed   By: Francene Boyers M.D.   On: 08/25/2017 13:46    ____________________________________________    PROCEDURES  Procedure(s) performed:    Procedures    Medications  ketorolac (TORADOL) 30 MG/ML injection 30 mg (30 mg Intramuscular Given 08/25/17 1420)     ____________________________________________   INITIAL IMPRESSION / ASSESSMENT AND PLAN / ED COURSE  Pertinent labs & imaging results that were available during my care of the patient were reviewed by me and considered in my medical decision making (see chart for details).  Review of the [place] CSRS was performed in accordance of the [facility] prior to dispensing any controlled drugs.   Patient's diagnosis is consistent with bunion and osteoarthritis.  Vital signs and exam are reassuring.  Symptoms are consistent with pain over bunion.  X-ray consistent with bunion and osteoarthritis.  IM Toradol was given with symptomatic relief.  Patient will be discharged home with prescriptions for naproxen. Patient is to follow up  with podiatry as directed. Patient is given ED precautions to return to the ED for any worsening or new symptoms.     ____________________________________________  FINAL CLINICAL IMPRESSION(S) / ED DIAGNOSES  Final diagnoses:  Bunion  Osteoarthritis of left foot, unspecified osteoarthritis type      NEW MEDICATIONS STARTED DURING THIS VISIT:  ED Discharge Orders        Ordered    naproxen (NAPROSYN) 500 MG tablet  2 times daily with meals     08/25/17 1405          This chart was dictated using voice recognition software/Dragon. Despite best efforts to proofread, errors can occur which can change the meaning. Any change was purely unintentional.    Enid Derry, PA-C 08/25/17 1526    Sharman Cheek, MD 08/27/17 [phone]

## 2017-08-25 NOTE — ED Notes (Signed)
Pt ambulatory upon discharge; declined wheel chair. Verbalized understanding of discharge instructions, follow-up care and prescription. VSS. Skin warm and dry. A&O x4.  

## 2017-08-25 NOTE — ED Triage Notes (Signed)
Patient presents to the ED with left great tow pain x 2 days.  Patient reports throbbing pain.  Patient walking with slight limp.  Patient denies history of diabetes.  Patient states he thinks this is gout.  No injury to toe.

## 2017-09-16 ENCOUNTER — Emergency Department
Admission: EM | Admit: 2017-09-16 | Discharge: 2017-09-16 | Disposition: A | Discharge status: Home / Self Care | Payer: BLUE CROSS/BLUE SHIELD | Attending: Emergency Medicine | Admitting: Emergency Medicine

## 2017-09-16 ENCOUNTER — Encounter: Payer: Self-pay | Admitting: Emergency Medicine

## 2017-09-16 ENCOUNTER — Emergency Department: Payer: BLUE CROSS/BLUE SHIELD

## 2017-09-16 ENCOUNTER — Other Ambulatory Visit: Payer: Self-pay

## 2017-09-16 ENCOUNTER — Ambulatory Visit: Payer: BLUE CROSS/BLUE SHIELD | Admitting: Adult Health

## 2017-09-16 ENCOUNTER — Encounter: Payer: Self-pay | Admitting: Adult Health

## 2017-09-16 VITALS — BP 145/92 | HR 66 | Temp 97.7°F | Resp 18 | Ht 76.0 in | Wt 279.8 lb

## 2017-09-16 DIAGNOSIS — M25421 Effusion, right elbow: Secondary | ICD-10-CM

## 2017-09-16 DIAGNOSIS — M7989 Other specified soft tissue disorders: Secondary | ICD-10-CM | POA: Diagnosis not present

## 2017-09-16 DIAGNOSIS — F17210 Nicotine dependence, cigarettes, uncomplicated: Secondary | ICD-10-CM | POA: Insufficient documentation

## 2017-09-16 DIAGNOSIS — M19021 Primary osteoarthritis, right elbow: Secondary | ICD-10-CM | POA: Diagnosis not present

## 2017-09-16 DIAGNOSIS — M25521 Pain in right elbow: Secondary | ICD-10-CM | POA: Diagnosis not present

## 2017-09-16 LAB — URIC ACID: Uric Acid, Serum: 8 mg/dL (ref 3.7–8.6)

## 2017-09-16 MED ORDER — ETODOLAC 400 MG PO TABS: 400.0000 mg | ORAL_TABLET | Freq: Two times a day (BID) | ORAL | 0 refills | Status: DC

## 2017-09-16 NOTE — Patient Instructions (Signed)
Given history of previous aspiration of right elbow patient will need to [facility] - walk in clinic 1-7:30 pm. Please go as soon as possible for evaluation. Return to clinic as needed.   Elbow Bursitis A bursa is a fluid-filled sac that covers and protects a joint. Bursitis is when the fluid-filled sac gets puffy and sore (inflamed). Elbow bursitis, also called olecranon bursitis, happens over your elbow. This may be caused by:  Injury (acute trauma) to your elbow.  Leaning on hard surfaces for long periods of time.  Infection from an injury that breaks the skin near your elbow.  A bone growth (spur) that forms at the tip of your elbow.  A medical condition that causes inflammation in your body, such as: ? Gout. ? Rheumatoid arthritis.  Sometimes the cause is not known. Follow these instructions at home:  Take medicines only as told by your doctor.  If you were prescribed an antibiotic medicine, finish all of it even if you start to feel better.  If your bursitis is caused by an injury, rest your elbow and wear your bandage as told by your doctor. You may also apply ice to the injured area as told by your doctor: ? Put ice in a plastic bag. ? Place a towel between your skin and the bag. ? Leave the ice on for 20 minutes, 2-3 times per day.  Do not do any activities that cause pain to your elbow.  Use elbow pads or wraps to cushion your elbow. Contact a doctor if:  You have a fever.  Your symptoms do not get better with treatment.  Your pain or swelling gets worse.  Your pain or swelling goes away and then comes back.  You have drainage of pus from the swollen area over your elbow. This information is not intended to replace advice given to you by your health care provider. Make sure you discuss any questions you have with your health care provider. Document Released: 07/23/2009 Document Revised: 07/11/2015 Document Reviewed:  10/11/2013 Elsevier Interactive Patient Education  [employer]2018 Elsevier Inc.

## 2017-09-16 NOTE — ED Provider Notes (Signed)
[facility] Emergency Department Provider Note   ____________________________________________   First MD Initiated Contact with Patient 09/16/17 1047     (approximate)  I have reviewed the triage vital signs and the nursing notes.   HISTORY  Chief Complaint Joint Swelling   HPI Stuart Mclaughlin is a 32 y.o. male is here with complaint of right elbow pain.  Patient states that he woke with pain to his elbow several days ago.  He has been taking over-the-counter medication without any improvement.  He denies any injury to his elbow.  He is unaware of any gout issues.  Currently rates his pain as 6/10.  Past Medical History:  Diagnosis Date  . Deep vein thrombosis (DVT) (HCC)     There are no active problems to display for this patient.   Past Surgical History:  Procedure Laterality Date  . PERIPHERAL VASCULAR CATHETERIZATION  04/01/2015   Procedure: Lower Extremity Venography;  Surgeon: Annice NeedyJason S Dew, MD;  Location: [facility] INVASIVE CV LAB;  Service: Cardiovascular;;  . PERIPHERAL VASCULAR CATHETERIZATION Right 04/01/2015   Procedure: Lower Extremity Intervention;  Surgeon: Annice NeedyJason S Dew, MD;  Location: [facility] INVASIVE CV LAB;  Service: Cardiovascular;  Laterality: Right;  . PERIPHERAL VASCULAR CATHETERIZATION N/A 04/01/2015   Procedure: IVC Filter Insertion;  Surgeon: Annice NeedyJason S Dew, MD;  Location: [facility] INVASIVE CV LAB;  Service: Cardiovascular;  Laterality: N/A;    Prior to Admission medications   Medication Sig Start Date End Date Taking? Authorizing Provider  etodolac (LODINE) 400 MG tablet Take 1 tablet (400 mg total) by mouth 2 (two) times daily. 09/16/17   Tommi RumpsSummers, Jamai Dolce L, PA-C    Allergies Patient has no known allergies.  No family history on file.  Social History Social History   Tobacco Use  . Smoking status: Current Every Day Smoker    Types: Cigarettes  . Smokeless tobacco: Never Used  . Tobacco comment: 2-3 ciggs per day per pt     Substance Use Topics  . Alcohol use: Yes    Alcohol/week: 1.2 oz    Types: 2 Cans of beer per week  . Drug use: No    Review of Systems Constitutional: No fever/chills Cardiovascular: Denies chest pain. Respiratory: Denies shortness of breath. Musculoskeletal: Positive for right elbow pain. Skin: Negative for rash. Neurological: Negative for  focal weakness or numbness. ____________________________________________   PHYSICAL EXAM:  VITAL SIGNS: ED Triage Vitals [09/16/17 1038]  Enc Vitals Group     BP 138/77     Pulse Rate 63     Resp 16     Temp 97.8 F (36.6 C)     Temp Source Oral     SpO2 100 %     Weight 274 lb (124.3 kg)     Height 6\' 4"  (1.93 m)     Head Circumference      Peak Flow      Pain Score 6     Pain Loc      Pain Edu?      Excl. in GC?    Constitutional: Alert and oriented. Well appearing and in no acute distress. Eyes: Conjunctivae are normal.  Head: Atraumatic. Neck: No stridor.   Cardiovascular: Normal rate, regular rhythm. Grossly normal heart sounds.  Good peripheral circulation. Respiratory: Normal respiratory effort.  No retractions. Lungs CTAB. Musculoskeletal: Examination of the right elbow there is no gross deformity however there is soft tissue swelling which is tender to palpation.  No erythema or warmth is  present.  Patient is able to rotate but not fully extend his upper extremities secondary to pain.  Motor or sensory function intact.  Skin without abrasion or ecchymosis. Neurologic:  Normal speech and language. No gross focal neurologic deficits are appreciated.  Skin:  Skin is warm, dry and intact.  Psychiatric: Mood and affect are normal. Speech and behavior are normal.  ____________________________________________   LABS (all labs ordered are listed, but only abnormal results are displayed)  Labs Reviewed  URIC ACID    RADIOLOGY  ED MD interpretation:   Right elbow x-ray is positive for large spur posterior  olecranon.  Official radiology report(s): Dg Elbow Complete Right  Result Date: 09/16/2017 CLINICAL DATA:  Pain and swelling.  No acute injury. EXAM: RIGHT ELBOW - COMPLETE 3+ VIEW COMPARISON:  No recent prior. FINDINGS: Noted is a bony protuberance from the olecranon process. This may injury P a fracture of this protuberance, age undetermined, may be present. Adjacent soft tissue swelling is noted. No other focal bony abnormality identified. IMPRESSION: Noted the body of the tube is from the olecranon process. This may be from old injury. A fracture of this protuberance, age undetermined, may be present. Adjacent soft tissue swelling is present. Electronically Signed   By: Maisie Fus  Register   On: 09/16/2017 11:23  ____________________________________________   PROCEDURES  Procedure(s) performed: None  Procedures  Critical Care performed: No  ____________________________________________   INITIAL IMPRESSION / ASSESSMENT AND PLAN / ED COURSE  As part of my medical decision making, I reviewed the following data within the electronic MEDICAL RECORD NUMBER Notes from prior ED visits and [place] Controlled Substance Database  Patient presents with right elbow pain without history of injury.  X-rays were taken with a large spur noted on the olecranon.  Patient was made aware that this most likely is because of his pain.  He was given a prescription for etodolac 400 mg twice daily with food.  He is to follow-up with his PCP or [facility] acute care if any continued problems. ____________________________________________   FINAL CLINICAL IMPRESSION(S) / ED DIAGNOSES  Final diagnoses:  Osteoarthritis of right elbow, unspecified osteoarthritis type     ED Discharge Orders        Ordered    etodolac (LODINE) 400 MG tablet  2 times daily,   Status:  Discontinued     09/16/17 1144    etodolac (LODINE) 400 MG tablet  2 times daily     09/16/17 1145       Note:  This document was prepared using  Dragon voice recognition software and may include unintentional dictation errors.    Tommi Rumps, PA-C 09/16/17 1521    Schaevitz, Myra Rude, MD 09/16/17 [phone]

## 2017-09-16 NOTE — ED Notes (Signed)
See triage note  Presents with swelling and some pain to right elbow for a few days  Denies any injury no deformity noted  Good pulses

## 2017-09-16 NOTE — ED Triage Notes (Signed)
PT to ED via POV with c/o RT elbow pain and swelling xfew days. Denies any acute injury, VSS

## 2017-09-16 NOTE — Discharge Instructions (Addendum)
Follow-up with your primary care provider or [facility] acute care if any continued problems.  You may also follow-up with Dr. Allena KatzPatel who is on-call for [facility] if any continued problems with your elbow.  Begin taking etodolac 400 mg twice daily with food.  Discontinue taking Aleve.  You may also use ice to your elbow as needed for discomfort and also for swelling.

## 2017-09-16 NOTE — Progress Notes (Addendum)
Subjective:     Patient ID: Stuart Mclaughlin, male   DOB: 1985-03-24, 32 y.o.   MRN: [id number]  Arm Pain   The incident occurred 3 to 5 days ago (09/12/17 onset: he also reports having this right elbow aspirated  for the same symptoms around three months ago. ). The incident occurred at home. 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 The pain is present in the right elbow. The quality of the pain is described as aching. The pain does not radiate. The pain is at a severity of 6/10. The pain is moderate. The pain has been constant since the incident. Pertinent negatives include no chest pain, muscle weakness, numbness or tingling. The symptoms are aggravated by movement, lifting and palpation. He has tried elevation, ice and NSAIDs for the symptoms. The treatment provided no relief.    Blood pressure (!) 145/92, pulse 66, temperature 97.7 F (36.5 C), temperature source Tympanic, resp. rate 18, height 6\' 4"  (1.93 m), weight 279 lb 12.8 oz (126.9 kg), SpO2 100 %.  Patient is a 32 year old male in no acute distress who comes to the clinic with complaints of elbow pain that started Sunday morning right elbow. 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   He has had this before 4 months ago he had to have this elbow drained and treated with antibiotics- he reports he was told it was arthritis.   Denies ever having gout, denies high  Purine diet or alcohol.   He has been taking Aleve for three days. He is taking this once daily.   He is a smoker.   Pain in elbow 6/10   Review of Systems  Constitutional: Negative.   HENT: Negative.   Respiratory: Negative.   Cardiovascular: Negative.  Negative for chest pain.  Gastrointestinal: Negative.   Genitourinary: Negative.   Musculoskeletal: Positive for arthralgias and joint swelling. Negative for back pain, gait problem, myalgias, neck pain and neck stiffness.  Skin:  Negative.   Neurological: Negative.  Negative for tingling and numbness.  Hematological: Negative.   Psychiatric/Behavioral: Negative.        Objective:   Physical Exam  Constitutional: He is oriented to person, place, and time. He appears well-developed and well-nourished. No distress.  HENT:  Head: Normocephalic and atraumatic.  Neck: Normal range of motion. Neck supple.  Cardiovascular: Normal rate, regular rhythm, normal heart sounds and intact distal pulses. Exam reveals no gallop and no friction rub.  No murmur heard. Pulmonary/Chest: Effort normal and breath sounds normal. No stridor. No respiratory distress. He has no wheezes. He has no rales. He exhibits no tenderness.  Abdominal: Soft. Bowel sounds are normal.  Musculoskeletal: Normal range of motion. He exhibits edema and tenderness. He exhibits no deformity.       Right shoulder: Normal.       Right elbow: He exhibits swelling and effusion. He exhibits normal range of motion, no deformity and no laceration. Tenderness found. No radial head, no medial epicondyle, no lateral epicondyle and no olecranon process tenderness noted.       Right wrist: Normal.       Right upper arm: Normal.       Right forearm: Normal.  Right elbow with edema and warmth, mild erythema spreading outside of edema. In comparison to left elbow the patients right elbow is 3+ times the size of unaffected left elbow. Painful right elbow with light palpation -  deep palpation not performed due to pain.  Significant pain with palpation and movement in all directions range of motion.    Neurological: He is alert and oriented to person, place, and time.  Skin: Skin is warm and dry. Capillary refill takes less than 2 seconds. No rash noted. He is not diaphoretic. There is erythema (right elbow ). No pallor.  Psychiatric: He has a normal mood and affect. His behavior is normal. Judgment and thought content normal.       Assessment:     Elbow swelling,  right  - RECURRENT in past three months.     Plan:     Mariel SleetBrandy Cozart RN called [facility] no office visit available but he is able to come to walk in clinic at 1 pm today 09/16/17. Patient verbalized understanding and he agrees he will go for imaging and further evaluation.   Discussed possible differentials including gout, arthritis, tennis elbow among others however given recent aspiration at providers best discretion to be seen by specialist today. Patient verbalized understanding. He has already been on NSAIDS.   Provider also recommends patient see primary care physician for a routine physical and to establish primary care. Patient may chose provider of choice. Also gave the [facility]  PHYSICIAN REFERRAL LINE at [phone] or web site at [email or web address] to help assist with finding a primary care doctor. Patient understands this office is acute care and no longer taking new primary care patients.  Advised patient call the office or your primary care doctor for an appointment if no improvement within 72 hours or if any symptoms change or worsen at any time  Advised ER or urgent Care if after hours or on weekend. Call 911 for emergency symptoms at any time.Patinet verbalized understanding of all instructions given/reviewed and treatment plan and has no further questions or concerns at this time.    Patient verbalized understanding of all instructions given and denies any further questions at this time.   Provider thoroughly discussed in collaboration above plan with supervising physician Dr. Julieanne Mansonichard Gilbert who is in agreement with the care plan as above.

## 2017-11-11 ENCOUNTER — Encounter: Payer: Self-pay | Admitting: Medical

## 2017-11-11 ENCOUNTER — Ambulatory Visit: Payer: Self-pay | Admitting: Adult Health

## 2017-11-11 ENCOUNTER — Ambulatory Visit: Payer: BLUE CROSS/BLUE SHIELD | Admitting: Medical

## 2017-11-11 VITALS — BP 139/88 | HR 64 | Temp 97.6°F | Ht 76.0 in | Wt 277.6 lb

## 2017-11-11 DIAGNOSIS — Z23 Encounter for immunization: Secondary | ICD-10-CM

## 2017-11-11 NOTE — Progress Notes (Signed)
   Subjective:    Patient ID: Murtis Sink, male    DOB: [birth year], 32 y.o.   MRN: [id number]  HPI  32 yo male in non acute distress. Presents today with complaints of rash on forearms x one week.Itchy, but not painful. 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 Denies rash spreading.  Blood pressure 139/88, pulse 64, temperature 97.6 F (36.4 C), temperature source Oral, height 6\' 4"  (1.93 m), weight 277 lb 9.6 oz (125.9 kg), SpO2 97 %.  Review of Systems  Constitutional: Negative for chills and fever.  HENT: Negative for congestion, ear pain and sore throat.   Eyes: Negative for discharge and itching.  Respiratory: Negative for cough and shortness of breath.   Cardiovascular: Negative for chest pain.  Gastrointestinal: Negative for abdominal pain.  Endocrine: Negative for polydipsia, polyphagia and polyuria.  Genitourinary: Negative for dysuria.  Musculoskeletal: Negative for myalgias.  Skin: Positive for rash.  Allergic/Immunologic: Positive for environmental allergies. Negative for food allergies.  Neurological: Negative for dizziness, syncope and light-headedness.  Hematological: Negative for adenopathy.  Psychiatric/Behavioral: Negative for behavioral problems, self-injury and suicidal ideas.   Last Tetanus vaccine in 2006.   DVT right leg 2016 Objective:   Physical Exam  Constitutional: He is oriented to person, place, and time. He appears well-developed and well-nourished.  HENT:  Head: Normocephalic and atraumatic.  Eyes: Pupils are equal, round, and reactive to light. Conjunctivae and EOM are normal.  Cardiovascular: Normal rate, regular rhythm and normal heart sounds.  Pulmonary/Chest: Effort normal and breath sounds normal.  Musculoskeletal: Normal range of motion.  Rash from wrists to elbows, non on hands  Fine maculopapular/ some with linear signs.  Neurological: He is alert and oriented to person, place, and time.  Skin:  Skin is warm and dry.  Psychiatric: He has a normal mood and affect. His behavior is normal. Judgment and thought content normal.  Nursing note and vitals reviewed.         Assessment & Plan:  Rash looks possibly like Scabies, will treat. Meds ordered this encounter  Medications  . permethrin (ELIMITE) 5 % cream    Sig: Apply 1 application topically once for 1 dose. Apply to body including scalp and face, avoid eyes/mouth at HS (leave on  8-14 hrs) rinse AM    Dispense:  1 g    Refill:  0  dispense 30 grams. One week follow up if not improving. OTC Zyrtec daily for itching..Patient verbalizes understanding and has no questions at discharge.

## 2017-11-11 NOTE — Patient Instructions (Addendum)
Take OTC Zyrtec daily for itching.DTaP Vaccine (Diphtheria, Tetanus, and Pertussis): What You Need to Know 1. Why get vaccinated? Diphtheria, tetanus, and pertussis are serious diseases caused by bacteria. Diphtheria and pertussis are spread from person to person. Tetanus enters the body through cuts or wounds. DIPHTHERIA causes a thick covering in the back of the throat.  It can lead to breathing problems, paralysis, heart failure, and even death.  TETANUS (Lockjaw) causes painful tightening of the muscles, usually all over the body.  It can lead to "locking" of the jaw so the victim cannot open his mouth or swallow. Tetanus leads to death in up to 2 out of 10 cases.  PERTUSSIS (Whooping Cough) causes coughing spells so bad that it is hard for infants to eat, drink, or breathe. These spells can last for weeks.  It can lead to pneumonia, seizures (jerking and staring spells), brain damage, and death.  Diphtheria, tetanus, and pertussis vaccine (DTaP) can help prevent these diseases. Most children who are vaccinated with DTaP will be protected throughout childhood. Many more children would get these diseases if we stopped vaccinating. DTaP is a safer version of an older vaccine called DTP. DTP is no longer used in the [place]. 2. Who should get DTaP vaccine and when? Children should get 5 doses of DTaP vaccine, one dose at each of the following ages:  2 months  4 months  6 months  15-18 months  4-6 years  DTaP may be given at the same time as other vaccines. 3. Some children should not get DTaP vaccine or should wait  Children with minor illnesses, such as a cold, may be vaccinated. But children who are moderately or severely ill should usually wait until they recover before getting DTaP vaccine.  Any child who had a life-threatening allergic reaction after a dose of DTaP should not get another dose.  Any child who suffered a brain or nervous system disease within 7 days  after a dose of DTaP should not get another dose.  Talk with your doctor if your child: ? had a seizure or collapsed after a dose of DTaP, ? cried non-stop for 3 hours or more after a dose of DTaP, ? had a fever over 105F after a dose of DTaP. Ask your doctor for more information. Some of these children should not get another dose of pertussis vaccine, but may get a vaccine without pertussis, called DT. 4. Older children and adults DTaP is not licensed for adolescents, adults, or children 66 years of age and older. But older people still need protection. A vaccine called Tdap is similar to DTaP. A single dose of Tdap is recommended for people 11 through 33 years of age. Another vaccine, called Td, protects against tetanus and diphtheria, but not pertussis. It is recommended every 10 years. There are separate Vaccine Information Statements for these vaccines. 5. What are the risks from DTaP vaccine? Getting diphtheria, tetanus, or pertussis disease is much riskier than getting DTaP vaccine. However, a vaccine, like any medicine, is capable of causing serious problems, such as severe allergic reactions. The risk of DTaP vaccine causing serious harm, or death, is extremely small. Mild problems (common)  Fever (up to about 1 child in 4)  Redness or swelling where the shot was given (up to about 1 child in 4)  Soreness or tenderness where the shot was given (up to about 1 child in 4) These problems occur more often after the 4th and 5th doses of  the DTaP series than after earlier doses. Sometimes the 4th or 5th dose of DTaP vaccine is followed by swelling of the entire arm or leg in which the shot was given, lasting 1-7 days (up to about 1 child in 30). Other mild problems include:  Fussiness (up to about 1 child in 3)  Tiredness or poor appetite (up to about 1 child in 10)  Vomiting (up to about 1 child in 50) These problems generally occur 1-3 days after the shot. Moderate problems  (uncommon)  Seizure (jerking or staring) (about 1 child out of 14,000)  Non-stop crying, for 3 hours or more (up to about 1 child out of 1,000)  High fever, over 105F (about 1 child out of 16,000) Severe problems (very rare)  Serious allergic reaction (less than 1 out of a million doses)  Several other severe problems have been reported after DTaP vaccine. These include: ? Long-term seizures, coma, or lowered consciousness ? Permanent brain damage. These are so rare it is hard to tell if they are caused by the vaccine. Controlling fever is especially important for children who have had seizures, for any reason. It is also important if another family member has had seizures. You can reduce fever and pain by giving your child an aspirin-free pain reliever when the shot is given, and for the next 24 hours, following the package instructions. 6. What if there is a serious reaction? What should I look for? Look for anything that concerns you, such as signs of a severe allergic reaction, very high fever, or behavior changes. Signs of a severe allergic reaction can include hives, swelling of the face and throat, difficulty breathing, a fast heartbeat, dizziness, and weakness. These would start a few minutes to a few hours after the vaccination. What should I do?  If you think it is a severe allergic reaction or other emergency that can't wait, call 9-1-1 or get the person to the nearest hospital. Otherwise, call your doctor.  Afterward, the reaction should be reported to the Vaccine Adverse Event Reporting System (VAERS). Your doctor might file this report, or you can do it yourself through the VAERS web site at www.vaers.LAgents.no, or by calling 1-919-272-1691. ? VAERS is only for reporting reactions. They do not give medical advice. 7. The National Vaccine Injury Compensation Program The [employer] Vaccine Injury Compensation Program (VICP) is a federal program that was created to compensate  people who may have been injured by certain vaccines. Persons who believe they may have been injured by a vaccine can learn about the program and about filing a claim by calling 1-(252) 585-3851 or visiting the VICP website at SpiritualWord.at. 8. How can I learn more?  Ask your doctor.  Call your local or state health department.  Contact the Centers for Disease Control and Prevention (CDC): ? Call 239-566-8237 (1-800-CDC-INFO) or ? Visit CDC's website at PicCapture.uy CDC DTaP Vaccine (Diphtheria, Tetanus, and Pertussis) VIS (07/02/05) This information is not intended to replace advice given to you by your health care provider. Make sure you discuss any questions you have with your health care provider. Document Released: 11/30/2005 Document Revised: 10/24/2015 Document Reviewed: 10/24/2015 Elsevier Interactive Patient Education  2017 Elsevier Inc.    Scabies, Adult Scabies is a skin condition that happens when very small insects get under the skin (infestation). This causes a rash and severe itchiness. Scabies can spread from person to person (is contagious). If you get scabies, it is common for others in your household to get  scabies too. With proper treatment, symptoms usually go away in 2-4 weeks. Scabies usually does not cause lasting problems. What are the causes? This condition is caused by mites (Sarcoptes scabiei, or human itch mites) that can only be seen with a microscope. The mites get into the top layer of skin and lay eggs. Scabies can spread from person to person through:  Close contact with a person who has scabies.  Contact with infested items, such as towels, bedding, or clothing.  What increases the risk? This condition is more likely to develop in:  People who live in nursing homes and other extended-care facilities.  People who have sexual contact with a partner who has scabies.  Young children who attend child care  facilities.  People who care for others who are at increased risk for scabies.  What are the signs or symptoms? Symptoms of this condition may include:  Severe itchiness. This is often worse at night.  A rash that includes tiny red bumps or blisters. The rash commonly occurs on the wrist, elbow, armpit, fingers, waist, groin, or buttocks. Bumps may form a line (burrow) in some areas.  Skin irritation. This can include scaly patches or sores.  How is this diagnosed? This condition is diagnosed with a physical exam. Your health care provider will look closely at your skin. In some cases, your health care provider may take a sample of your affected skin (skin scraping) and have it examined under a microscope. How is this treated? This condition may be treated with:  Medicated cream or lotion that kills the mites. This is spread on the entire body and left on for several hours. Usually, one treatment with medicated cream or lotion is enough to kill all of the mites. In severe cases, the treatment may be repeated.  Medicated cream that relieves itching.  Medicines that help to relieve itching.  Medicines that kill the mites. This treatment is rarely used.  Follow these instructions at home:  Medicines  Take or apply over-the-counter and prescription medicines as told by your health care provider.  Apply medicated cream or lotion as told by your health care provider.  Do not wash off the medicated cream or lotion until the necessary amount of time has passed. Skin Care  Avoid scratching your affected skin.  Keep your fingernails closely trimmed to reduce injury from scratching.  Take cool baths or apply cool washcloths to help reduce itching. General instructions  Clean all items that you recently had contact with, including bedding, clothing, and furniture. Do this on the same day that your treatment starts. ? Use hot water when you wash items. ? Place unwashable items into  closed, airtight plastic bags for at least 3 days. The mites cannot live for more than 3 days away from human skin. ? Vacuum furniture and mattresses that you use.  Make sure that other people who may have been infested are examined by a health care provider. These include members of your household and anyone who may have had contact with infested items.  Keep all follow-up visits as told by your health care provider. This is important. Contact a health care provider if:  You have itching that does not go away after 4 weeks of treatment.  You continue to develop new bumps or burrows.  You have redness, swelling, or pain in your rash area after treatment.  You have fluid, blood, or pus coming from your rash. This information is not intended to replace advice given  to you by your health care provider. Make sure you discuss any questions you have with your health care provider. Document Released: 10/24/2014 Document Revised: 07/11/2015 Document Reviewed: 09/04/2014 Elsevier Interactive Patient Education  [employer].

## 2017-11-12 MED ORDER — PERMETHRIN 5 % EX CREA: 1.0000 "application " | TOPICAL_CREAM | Freq: Once | CUTANEOUS | 0 refills | Status: AC

## 2018-08-10 ENCOUNTER — Other Ambulatory Visit: Payer: Self-pay

## 2018-08-10 ENCOUNTER — Emergency Department
Admission: EM | Admit: 2018-08-10 | Discharge: 2018-08-10 | Disposition: A | Discharge status: Home / Self Care | Payer: BC Managed Care – PPO | Attending: Emergency Medicine | Admitting: Emergency Medicine

## 2018-08-10 DIAGNOSIS — M10022 Idiopathic gout, left elbow: Secondary | ICD-10-CM | POA: Diagnosis not present

## 2018-08-10 DIAGNOSIS — M1000 Idiopathic gout, unspecified site: Secondary | ICD-10-CM | POA: Insufficient documentation

## 2018-08-10 DIAGNOSIS — R2232 Localized swelling, mass and lump, left upper limb: Secondary | ICD-10-CM | POA: Diagnosis present

## 2018-08-10 LAB — COMPREHENSIVE METABOLIC PANEL
ALT: 65 U/L — ABNORMAL HIGH (ref 0–44)
AST: 40 U/L (ref 15–41)
Albumin: 4 g/dL (ref 3.5–5.0)
Alkaline Phosphatase: 58 U/L (ref 38–126)
Anion gap: 9 (ref 5–15)
BUN: 10 mg/dL (ref 6–20)
CO2: 24 mmol/L (ref 22–32)
Calcium: 9 mg/dL (ref 8.9–10.3)
Chloride: 107 mmol/L (ref 98–111)
Creatinine, Ser: 1 mg/dL (ref 0.61–1.24)
GFR calc Af Amer: >60 mL/min (ref >60)
GFR calc non Af Amer: >60 mL/min (ref >60)
Glucose, Bld: 105 mg/dL — ABNORMAL HIGH (ref 70–99)
Potassium: 4 mmol/L (ref 3.5–5.1)
Sodium: 140 mmol/L (ref 135–145)
Total Bilirubin: 0.5 mg/dL (ref 0.3–1.2)
Total Protein: 7.6 g/dL (ref 6.5–8.1)

## 2018-08-10 LAB — CBC WITH DIFFERENTIAL/PLATELET
Abs Immature Granulocytes: 0.03 10*3/uL (ref 0.00–0.07)
Basophils Absolute: 0 10*3/uL (ref 0.0–0.1)
Basophils Relative: 1 %
Eosinophils Absolute: 0 10*3/uL (ref 0.0–0.5)
Eosinophils Relative: 1 %
HCT: 44.8 % (ref 39.0–52.0)
Hemoglobin: 15.4 g/dL (ref 13.0–17.0)
Immature Granulocytes: 1 %
Lymphocytes Relative: 45 %
Lymphs Abs: 2.4 10*3/uL (ref 0.7–4.0)
MCH: 32.3 pg (ref 26.0–34.0)
MCHC: 34.4 g/dL (ref 30.0–36.0)
MCV: 93.9 fL (ref 80.0–100.0)
Monocytes Absolute: 0.4 10*3/uL (ref 0.1–1.0)
Monocytes Relative: 8 %
Neutro Abs: 2.5 10*3/uL (ref 1.7–7.7)
Neutrophils Relative %: 44 %
Platelets: 187 10*3/uL (ref 150–400)
RBC: 4.77 MIL/uL (ref 4.22–5.81)
RDW: 12.5 % (ref 11.5–15.5)
WBC: 5.4 10*3/uL (ref 4.0–10.5)
nRBC: 0 % (ref 0.0–0.2)

## 2018-08-10 LAB — URIC ACID: Uric Acid, Serum: 8.9 mg/dL — ABNORMAL HIGH (ref 3.7–8.6)

## 2018-08-10 MED ORDER — NAPROXEN 500 MG PO TABS: 500.0000 mg | ORAL_TABLET | Freq: Two times a day (BID) | ORAL | 2 refills | Status: DC

## 2018-08-10 MED ORDER — COLCHICINE 0.6 MG PO TABS: 0.6000 mg | ORAL_TABLET | Freq: Every day | ORAL | 2 refills | Status: DC

## 2018-08-10 NOTE — ED Triage Notes (Signed)
Pt c/o BL LE swelling and left arm swelling for the past 2 weeks and concerned due to a hx of DVT.

## 2018-08-10 NOTE — Discharge Instructions (Signed)
Follow-up with your regular doctor if not better in 3 days.  Return emergency department worsening.  Take medications as prescribed.  Decrease alcohol intake, ground meats, or any wild game that you may eat.

## 2018-08-10 NOTE — ED Provider Notes (Signed)
-----------------------------------------   1:15 PM on 08/10/2018 -----------------------------------------  Patient seen and evaluated by myself in conjunction with Ashok Cordia.  Overall the patient appears very well, uric acid level is elevated.  Patient has no personal history of gout however in speaking to the patient he states he will often get pain and tenderness to the base of his big toe, which would be consistent with gout.  We will treat with colchicine, and anti-inflammatories.  Patient is agreeable to plan of care.  I discussed return precautions with the patient.   Harvest Dark, MD 08/10/18 1315

## 2018-08-10 NOTE — ED Provider Notes (Signed)
[facility] Emergency Department Provider Note  ____________________________________________   None    (approximate)  I have reviewed the triage vital signs and the nursing notes.   HISTORY  Chief Complaint Leg Swelling    HPI Stuart Mclaughlin is a 33 y.o. male presents emergency department complaining of lower extremity swelling and left elbow swelling for the past 2 weeks.  Has a history of DVTs.  States leg pain is basically stopped.  States most of the swelling pain is moved into the left elbow.  He denies any chest pain or shortness of breath.  Positive smoker, socially, patient states he has been drinking beer and eating a lot of hot dogs/hamburgers.    Past Medical History:  Diagnosis Date  . Deep vein thrombosis (DVT) (HCC)     There are no active problems to display for this patient.   Past Surgical History:  Procedure Laterality Date  . PERIPHERAL VASCULAR CATHETERIZATION  04/01/2015   Procedure: Lower Extremity Venography;  Surgeon: Algernon Huxley, MD;  Location: [place] [facility];  Service: Cardiovascular;;  . PERIPHERAL VASCULAR CATHETERIZATION Right 04/01/2015   Procedure: Lower Extremity Intervention;  Surgeon: Algernon Huxley, MD;  Location: [place] [facility];  Service: Cardiovascular;  Laterality: Right;  . PERIPHERAL VASCULAR CATHETERIZATION N/A 04/01/2015   Procedure: IVC Filter Insertion;  Surgeon: Algernon Huxley, MD;  Location: [place] [facility];  Service: Cardiovascular;  Laterality: N/A;    Prior to Admission medications   Medication Sig Start Date End Date Taking? Authorizing Provider  colchicine 0.6 MG tablet Take 1 tablet (0.6 mg total) by mouth daily. 08/10/18 08/10/19  Caryn Section, Linden Dolin, PA-C  naproxen (NAPROSYN) 500 MG tablet Take 1 tablet (500 mg total) by mouth 2 (two) times daily with a meal. 08/10/18 08/10/19  Deidre Carino, Linden Dolin, PA-C    Allergies Patient has no known allergies.  No family history on file.  Social  History Social History   Tobacco Use  . Smoking status: Smoker, Current Status Unknown    Types: Cigarettes  . Smokeless tobacco: Never Used  . Tobacco comment: pt states he does not smoke, vape or use smokeless tobacco  Substance Use Topics  . Alcohol use: Yes    Alcohol/week: 2.0 standard drinks    Types: 2 Cans of beer per week  . Drug use: No    Review of Systems  Constitutional: No fever/chills Eyes: No visual changes. ENT: No sore throat. Respiratory: Denies cough Genitourinary: Negative for dysuria. Musculoskeletal: Negative for back pain. Skin: Negative for rash.    ____________________________________________   PHYSICAL EXAM:  VITAL SIGNS: ED Triage Vitals  Enc Vitals Group     BP 08/10/18 1124 (!) 140/103     Pulse Rate 08/10/18 1124 78     Resp 08/10/18 1124 18     Temp 08/10/18 1124 98.2 F (36.8 C)     Temp Source 08/10/18 1124 Oral     SpO2 08/10/18 1124 97 %     Weight 08/10/18 1125 275 lb (124.7 kg)     Height 08/10/18 1125 '6\' 4"'  (1.93 m)     Head Circumference --      Peak Flow --      Pain Score 08/10/18 1125 6     Pain Loc --      Pain Edu? --      Excl. in [place]? --     Constitutional: Alert and oriented. Well appearing and in no acute distress.  Eyes: Conjunctivae are normal.  Head: Atraumatic. Nose: No congestion/rhinnorhea. Mouth/Throat: Mucous membranes are moist.   Neck:  supple no lymphadenopathy noted Cardiovascular: Normal rate, regular rhythm. Heart sounds are normal Respiratory: Normal respiratory effort.  No retractions, lungs c t a  Abd: soft nontender bs normal all 4 quad GU: deferred Musculoskeletal: FROM all extremities, warm and well perfused, calves are nontender, lower extremities are nontender, left elbow is warm and tender Neurologic:  Normal speech and language.  Skin:  Skin is warm, dry and intact. No rash noted. Psychiatric: Mood and affect are normal. Speech and behavior are normal.   ____________________________________________   LABS (all labs ordered are listed, but only abnormal results are displayed)  Labs Reviewed  COMPREHENSIVE METABOLIC PANEL - Abnormal; Notable for the following components:      Result Value   Glucose, Bld 105 (*)    ALT 65 (*)    All other components within normal limits  URIC ACID - Abnormal; Notable for the following components:   Uric Acid, Serum 8.9 (*)    All other components within normal limits  CBC WITH DIFFERENTIAL/PLATELET   ____________________________________________   ____________________________________________  RADIOLOGY    ____________________________________________   PROCEDURES  Procedure(s) performed: No  Procedures    ____________________________________________   INITIAL IMPRESSION / ASSESSMENT AND PLAN / ED COURSE  Pertinent labs & imaging results that were available during my care of the patient were reviewed by me and considered in my medical decision making (see chart for details).   Patient is 33 year old male presents emergency department complaint of left elbow swelling, states he had pain in the lower legs but now it settled into the left elbow.  He denies tick bite.  States he has history of DVT.  Has not had any chest pain or shortness of breath.  He has been drinking EtOH and eating products with ground meat.  Physical exam shows patient appears well.  Vitals are basically normal except blood pressure is mildly elevated at 140/103.  Lower extremities are nontender.  Calves are nontender.  Left elbow is warm and tender to touch.  DDX: Gout, septic arthritis, DVT of upper extremity  Labs for CBC, metabolic panel, and uric acid ordered    ----------------------------------------- 1:26 PM on 08/10/2018 -----------------------------------------  Uric acid is elevated at 8.9, comprehensive metabolic panel is normal, CBC is normal.  Explained findings to the patient.  Explained to him to  decrease the amount of ground beef and alcohol in his diet.  Also warned him of the purines and wild game such as deer meat.  He is to follow-up with his regular doctor if not better to 3 days.  Return emergency department worsening.  He states he understands will comply.  Is discharged stable condition with a prescription for colchicine and Naprosyn.  As part of my medical decision making, I reviewed the following data within the [place] notes reviewed and incorporated, Labs reviewed uric acid elevated 8.9, CBC and met B are normal, Old chart reviewed, Evaluated by EM attending Dr. Kerman Passey, Notes from prior ED visits and [place] Controlled Substance Database  ____________________________________________   FINAL CLINICAL IMPRESSION(S) / ED DIAGNOSES  Final diagnoses:  Acute idiopathic gout, unspecified site      NEW MEDICATIONS STARTED DURING THIS VISIT:  Discharge Medication List as of 08/10/2018  1:14 PM    START taking these medications   Details  colchicine 0.6 MG tablet Take 1 tablet (0.6 mg total) by mouth daily.,  Starting Wed 08/10/2018, Until Thu 08/10/2019, Normal    naproxen (NAPROSYN) 500 MG tablet Take 1 tablet (500 mg total) by mouth 2 (two) times daily with a meal., Starting Wed 08/10/2018, Until Thu 08/10/2019, Normal         Note:  This document was prepared using Dragon voice recognition software and may include unintentional dictation errors.    Versie Starks, PA-C 08/10/18 1327    Harvest Dark, MD 08/10/18 1459

## 2018-08-15 ENCOUNTER — Other Ambulatory Visit: Payer: BC Managed Care – PPO

## 2018-08-15 ENCOUNTER — Telehealth: Payer: Self-pay | Admitting: *Deleted

## 2018-08-15 DIAGNOSIS — R6889 Other general symptoms and signs: Secondary | ICD-10-CM | POA: Diagnosis not present

## 2018-08-15 DIAGNOSIS — Z20822 Contact with and (suspected) exposure to covid-19: Secondary | ICD-10-CM

## 2018-08-15 NOTE — Telephone Encounter (Signed)
Left message for pt to call back to be scheduled for his COVID-19 test.  [phone].

## 2018-08-15 NOTE — Telephone Encounter (Signed)
Stuart Mclaughlin at [place] request COVID testing  Exposure

## 2018-08-18 ENCOUNTER — Telehealth: Payer: Self-pay | Admitting: *Deleted

## 2018-08-18 LAB — NOVEL CORONAVIRUS, NAA: SARS-CoV-2, NAA: NOT DETECTED

## 2018-08-18 NOTE — Telephone Encounter (Signed)
Reviewed negative [phone] results with patient. Requested a print out to take to employer. Offered to set up MyChart. Declined at this time stating he would later. No further questions

## 2018-11-24 ENCOUNTER — Emergency Department: Payer: BC Managed Care – PPO

## 2018-11-24 ENCOUNTER — Emergency Department
Admission: EM | Admit: 2018-11-24 | Discharge: 2018-11-24 | Disposition: A | Discharge status: Home / Self Care | Payer: BC Managed Care – PPO | Attending: Emergency Medicine | Admitting: Emergency Medicine

## 2018-11-24 ENCOUNTER — Other Ambulatory Visit: Payer: Self-pay

## 2018-11-24 ENCOUNTER — Encounter: Payer: Self-pay | Admitting: Emergency Medicine

## 2018-11-24 DIAGNOSIS — X500XXA Overexertion from strenuous movement or load, initial encounter: Secondary | ICD-10-CM | POA: Insufficient documentation

## 2018-11-24 DIAGNOSIS — Y999 Unspecified external cause status: Secondary | ICD-10-CM | POA: Insufficient documentation

## 2018-11-24 DIAGNOSIS — S4991XA Unspecified injury of right shoulder and upper arm, initial encounter: Secondary | ICD-10-CM | POA: Diagnosis present

## 2018-11-24 DIAGNOSIS — Y9289 Other specified places as the place of occurrence of the external cause: Secondary | ICD-10-CM | POA: Diagnosis not present

## 2018-11-24 DIAGNOSIS — Y9389 Activity, other specified: Secondary | ICD-10-CM | POA: Diagnosis not present

## 2018-11-24 DIAGNOSIS — M25511 Pain in right shoulder: Secondary | ICD-10-CM | POA: Diagnosis not present

## 2018-11-24 DIAGNOSIS — S46911A Strain of unspecified muscle, fascia and tendon at shoulder and upper arm level, right arm, initial encounter: Secondary | ICD-10-CM | POA: Diagnosis not present

## 2018-11-24 MED ORDER — IBUPROFEN 600 MG PO TABS: 600.0000 mg | ORAL_TABLET | Freq: Three times a day (TID) | ORAL | 0 refills | Status: DC | PRN

## 2018-11-24 MED ORDER — LIDOCAINE 5 % EX PTCH
1.0000 | MEDICATED_PATCH | CUTANEOUS | Status: DC
  Administered 2018-11-24: 1 via TRANSDERMAL
  Filled 2018-11-24: qty 1

## 2018-11-24 MED ORDER — TRAMADOL HCL 50 MG PO TABS: 50.0000 mg | ORAL_TABLET | Freq: Four times a day (QID) | ORAL | 0 refills | Status: DC | PRN

## 2018-11-24 NOTE — ED Triage Notes (Signed)
Pt reports a week or two ago he helped someone move and things he pulled something in his right shoulder. Pt reports painful to lift right arm above head.

## 2018-11-24 NOTE — ED Notes (Signed)
See triage note  States he was helping someone move about 1-2 weeks ago  Developed pain to right shoulder  No deformity noted   Having increased pain with movement  Good pulses

## 2018-11-24 NOTE — Discharge Instructions (Signed)
Wear arm sling for 2 to 3 days as needed.

## 2018-11-24 NOTE — ED Provider Notes (Signed)
[facility] Emergency Department Provider Note   ____________________________________________   First MD Initiated Contact with Patient 11/24/18 1146     (approximate)  I have reviewed the triage vital signs and the nursing notes.   HISTORY  Chief Complaint Shoulder Pain    HPI Stuart Mclaughlin is a 33 y.o. male patient complain of right shoulder pain for 2 weeks.  Onset of complaint when he was helping someone move furniture.  Patient states pain increased with abduction and overhead reaching.  Patient rates the pain as a 7/10.  Patient described the pain as "achy".  Patient denies loss of sensation.  No palliative measures for complaint.         Past Medical History:  Diagnosis Date  . Deep vein thrombosis (DVT) (HCC)     There are no active problems to display for this patient.   Past Surgical History:  Procedure Laterality Date  . PERIPHERAL VASCULAR CATHETERIZATION  04/01/2015   Procedure: Lower Extremity Venography;  Surgeon: Annice Needy, MD;  Location: [facility] INV[facility]IVE [facility];  Service: Cardiovascular;;  . PERIPHERAL VASCULAR CATHETERIZATION Right 04/01/2015   Procedure: Lower Extremity Intervention;  Surgeon: Annice Needy, MD;  Location: [facility] INV[facility];  Service: Cardiovascular;  Laterality: Right;  . PERIPHERAL VASCULAR CATHETERIZATION N/A 04/01/2015   Procedure: IVC Filter Insertion;  Surgeon: Annice Needy, MD;  Location: [facility] INV[facility] CV [facility];  Service: Cardiovascular;  Laterality: N/A;    Prior to Admission medications   Medication Sig Start Date End Date Taking? Authorizing Provider  ibuprofen (ADVIL) 600 MG tablet Take 1 tablet (600 mg total) by mouth every 8 (eight) hours as needed. 11/24/18   Joni Reining, PA-C  traMADol (ULTRAM) 50 MG tablet Take 1 tablet (50 mg total) by mouth every 6 (six) hours as needed. 11/24/18 11/24/19  Joni Reining, PA-C    Allergies Patient has no known allergies.  No family history on file.   Social History Social History   Tobacco Use  . Smoking status: Smoker, Current Status Unknown    Types: Cigarettes  . Smokeless tobacco: Never Used  . Tobacco comment: pt states he does not smoke, vape or use smokeless tobacco  Substance Use Topics  . Alcohol use: Yes    Alcohol/week: 2.0 standard drinks    Types: 2 Cans of beer per week  . Drug use: No    Review of Systems Constitutional: No fever/chills Eyes: No visual changes. ENT: No sore throat. Cardiovascular: Denies chest pain. Respiratory: Denies shortness of breath. Gastrointestinal: No abdominal pain.  No nausea, no vomiting.  No diarrhea.  No constipation. Genitourinary: Negative for dysuria. Musculoskeletal: Right shoulder pain.  Skin: Negative for rash. Neurological: Negative for headaches, focal weakness or numbness.   ____________________________________________   PHYSICAL EXAM:  VITAL SIGNS: ED Triage Vitals [11/24/18 1128]  Enc Vitals Group     BP (!) 119/95     Pulse Rate 94     Resp 20     Temp 98.4 F (36.9 C)     Temp Source Oral     SpO2 99 %     Weight 275 lb (124.7 kg)     Height 6\' 4"  (1.93 m)     Head Circumference      Peak Flow      Pain Score 7     Pain Loc      Pain Edu?      Excl. in GC?    Constitutional:  Alert and oriented. Well appearing and in no acute distress. Neck:No cervical spine tenderness to palpation. Hematological/Lymphatic/Immunilogical: No cervical lymphadenopathy. Cardiovascular: Normal rate, regular rhythm. Grossly normal heart sounds.  Good peripheral circulation. Respiratory: Normal respiratory effort.  No retractions. Lungs CTAB. Musculoskeletal: No obvious deformity to the right shoulder.  Patient is moderate guarding palpation of superior aspect of the humerus.  Decreased range of motion with abduction and overhead reaching.  Strength against resistance is 3/5.  Neurologic:  Normal speech and language. No gross focal neurologic deficits are appreciated. No  gait instability. Skin:  Skin is warm, dry and intact. No rash noted. Psychiatric: Mood and affect are normal. Speech and behavior are normal.  ____________________________________________   LABS (all labs ordered are listed, but only abnormal results are displayed)  Labs Reviewed - No data to display ____________________________________________  EKG   ____________________________________________  RADIOLOGY  ED MD interpretation:    Official radiology report(s): Dg Shoulder Right  Result Date: 11/24/2018 CLINICAL DATA:  Right shoulder pain for 1-2 weeks after pulling injury. Initial encounter. EXAM: RIGHT SHOULDER - 2+ VIEW COMPARISON:  None. FINDINGS: There is no evidence of fracture or dislocation. There is no evidence of arthropathy or other focal bone abnormality. Soft tissues are unremarkable. IMPRESSION: Negative exam. Electronically Signed   By: Inge Rise M.D.   On: 11/24/2018 12:20    ____________________________________________   PROCEDURES  Procedure(s) performed (including Critical Care):  Procedures   ____________________________________________   INITIAL IMPRESSION / ASSESSMENT AND PLAN / ED COURSE  As part of my medical decision making, I reviewed the following data within the [place] was evaluated in Emergency Department on 11/24/2018 for the symptoms described in the history of present illness. He was evaluated in the context of the global COVID-19 pandemic, which necessitated consideration that the patient might be at risk for infection with the SARS-CoV-2 virus that causes COVID-19. Institutional protocols and algorithms that pertain to the evaluation of patients at risk for COVID-19 are in a state of rapid change based on information released by regulatory bodies including the CDC and federal and state organizations. These policies and algorithms were followed during the patient's care in the ED.   Patient presents with 2 weeks of right shoulder pain secondary to repetitive lifting incident.  Physical exam remarkable only for decreased range of motion and strength.  Discussed neck x-ray findings with patient.  Patient placed in arm sling and given discharge care instruction.  Patient advised follow orthopedic for definitive evaluation treatment.      ____________________________________________   FINAL CLINICAL IMPRESSION(S) / ED DIAGNOSES  Final diagnoses:  Strain of right shoulder, initial encounter     ED Discharge Orders         Ordered    traMADol (ULTRAM) 50 MG tablet  Every 6 hours PRN     11/24/18 1245    ibuprofen (ADVIL) 600 MG tablet  Every 8 hours PRN     11/24/18 1245           Note:  This document was prepared using Dragon voice recognition software and may include unintentional dictation errors.    Sable Feil, PA-C 11/24/18 1252    Nena Polio, MD 11/24/18 [phone]

## 2018-12-07 ENCOUNTER — Emergency Department: Payer: BC Managed Care – PPO

## 2018-12-07 ENCOUNTER — Other Ambulatory Visit: Payer: Self-pay

## 2018-12-07 ENCOUNTER — Emergency Department
Admission: EM | Admit: 2018-12-07 | Discharge: 2018-12-07 | Disposition: A | Discharge status: Home / Self Care | Payer: BC Managed Care – PPO | Attending: Student in an Organized Health Care Education/Training Program | Admitting: Student in an Organized Health Care Education/Training Program

## 2018-12-07 DIAGNOSIS — R2241 Localized swelling, mass and lump, right lower limb: Secondary | ICD-10-CM | POA: Diagnosis not present

## 2018-12-07 DIAGNOSIS — M25561 Pain in right knee: Secondary | ICD-10-CM | POA: Diagnosis not present

## 2018-12-07 DIAGNOSIS — F17210 Nicotine dependence, cigarettes, uncomplicated: Secondary | ICD-10-CM | POA: Insufficient documentation

## 2018-12-07 DIAGNOSIS — R600 Localized edema: Secondary | ICD-10-CM | POA: Diagnosis not present

## 2018-12-07 MED ORDER — HYDROCODONE-ACETAMINOPHEN 5-325 MG PO TABS
1.0000 | ORAL_TABLET | Freq: Once | ORAL | Status: AC
  Administered 2018-12-07: 09:00:00 1 via ORAL
  Filled 2018-12-07: qty 1

## 2018-12-07 MED ORDER — HYDROCODONE-ACETAMINOPHEN 5-325 MG PO TABS: 1.0000 | ORAL_TABLET | ORAL | 0 refills | Status: DC | PRN

## 2018-12-07 MED ORDER — NAPROXEN 500 MG PO TABS: 500.0000 mg | ORAL_TABLET | Freq: Two times a day (BID) | ORAL | 0 refills | Status: AC

## 2018-12-07 NOTE — ED Notes (Signed)
Patient transported to Ultrasound 

## 2018-12-07 NOTE — ED Provider Notes (Signed)
[facility] Emergency Department Provider Note    First MD Initiated Contact with Patient 12/07/18 0802     (approximate)  I have reviewed the triage vital signs and the nursing notes.   HISTORY  Chief Complaint Knee Pain    HPI Stuart Mclaughlin is a 33 y.o. male almost 1 week of progressively worsening right knee pain.  Denies any trauma.  States he feels like his right leg is a little bit more swollen than previous.  Does have a history of DVT.  Also has been recently treated for gout.  Does not follow-up with PCP.  Denies any heavy alcohol user consumption of red meats.  Denies any fevers.    Past Medical History:  Diagnosis Date   Deep vein thrombosis (DVT) (HCC)    No family history on file. Past Surgical History:  Procedure Laterality Date   PERIPHERAL VASCULAR CATHETERIZATION  04/01/2015   Procedure: Lower Extremity Venography;  Surgeon: Algernon Huxley, MD;  Location: [place] [facility];  Service: Cardiovascular;;   PERIPHERAL VASCULAR CATHETERIZATION Right 04/01/2015   Procedure: Lower Extremity Intervention;  Surgeon: Algernon Huxley, MD;  Location: [place] [facility];  Service: Cardiovascular;  Laterality: Right;   PERIPHERAL VASCULAR CATHETERIZATION N/A 04/01/2015   Procedure: IVC Filter Insertion;  Surgeon: Algernon Huxley, MD;  Location: [place] [facility];  Service: Cardiovascular;  Laterality: N/A;   There are no active problems to display for this patient.     Prior to Admission medications   Medication Sig Start Date End Date Taking? Authorizing Provider  HYDROcodone-acetaminophen (NORCO) 5-325 MG tablet Take 1 tablet by mouth every 4 (four) hours as needed for moderate pain. 12/07/18   Merlyn Lot, MD  ibuprofen (ADVIL) 600 MG tablet Take 1 tablet (600 mg total) by mouth every 8 (eight) hours as needed. 11/24/18   Sable Feil, PA-C  MITIGARE 0.6 MG CAPS Take 1 tablet by mouth daily. 08/10/18   [provider]    naproxen (NAPROSYN) 500 MG tablet Take 1 tablet (500 mg total) by mouth 2 (two) times daily with a meal for 7 days. 12/07/18 12/14/18  Merlyn Lot, MD  traMADol (ULTRAM) 50 MG tablet Take 1 tablet (50 mg total) by mouth every 6 (six) hours as needed. 11/24/18 11/24/19  Sable Feil, PA-C    Allergies Patient has no known allergies.    Social History Social History   Tobacco Use   Smoking status: Smoker, Current Status Unknown    Types: Cigarettes   Smokeless tobacco: Never Used   Tobacco comment: pt states he does not smoke, vape or use smokeless tobacco  Substance Use Topics   Alcohol use: Yes    Alcohol/week: 2.0 standard drinks    Types: 2 Cans of beer per week   Drug use: No    Review of Systems Patient denies headaches, rhinorrhea, blurry vision, numbness, shortness of breath, chest pain, edema, cough, abdominal pain, nausea, vomiting, diarrhea, dysuria, fevers, rashes or hallucinations unless otherwise stated above in HPI. ____________________________________________   PHYSICAL EXAM:  VITAL SIGNS: Vitals:   12/07/18 1105 12/07/18 1130  BP: (!) 138/95 (!) 136/101  Pulse: (!) 57 (!) 58  Resp: 18   Temp:    SpO2: 98% 99%    Constitutional: Alert and oriented.  Eyes: Conjunctivae are normal.  Head: Atraumatic. Nose: No congestion/rhinnorhea. Mouth/Throat: Mucous membranes are moist.   Neck: No stridor. Painless ROM.  Cardiovascular: Normal rate, regular rhythm. Grossly normal  heart sounds.  Good peripheral circulation. Respiratory: Normal respiratory effort.  No retractions. Lungs CTAB. Gastrointestinal: Soft and nontender. No distention. No abdominal bruits. No CVA tenderness. Genitourinary:  Musculoskeletal: Tenderness palpation of the anterior knee just superior to the tibial tuberosity.  No significant effusion.  Patella nontender.  No valgus or varus instability.  Some discomfort with passive and active flexion extension of the knee with some  crepitus.  No overlying warmth or erythema.  NV intact to BLE.Marland Kitchen  No joint effusions. Neurologic:  Normal speech and language. No gross focal neurologic deficits are appreciated. No facial droop Skin:  Skin is warm, dry and intact. No rash noted. Psychiatric: Mood and affect are normal. Speech and behavior are normal.  ____________________________________________   LABS (all labs ordered are listed, but only abnormal results are displayed)  No results found for this or any previous visit (from the past 24 hour(s)). ____________________________________________  ____________________________________________  RADIOLOGY  I personally reviewed all radiographic images ordered to evaluate for the above acute complaints and reviewed radiology reports and findings.  These findings were personally discussed with the patient.  Please see medical record for radiology report.  ____________________________________________   PROCEDURES  Procedure(s) performed:  Procedures    Critical Care performed: no ____________________________________________   INITIAL IMPRESSION / ASSESSMENT AND PLAN / ED COURSE  Pertinent labs & imaging results that were available during my care of the patient were reviewed by me and considered in my medical decision making (see chart for details).   DDX: Fracture, sprain, contusion, DVT, arthritis, septic arthritis  Stuart Mclaughlin is a 32 y.o. who presents to the ED with right knee pain as described above.  He is afebrile.  Exam as above.  No evidence of DVT.  Is not consistent with septic arthritis.  Probable arthritis.  May be gout but clinically seems less consistent with that process.  Will give pain control.  No evidence of fracture.  I do believe he stable and appropriate for outpatient follow-up.     The patient was evaluated in Emergency Department today for the symptoms described in the history of present illness. He/she was evaluated in the context of  the global COVID-19 pandemic, which necessitated consideration that the patient might be at risk for infection with the SARS-CoV-2 virus that causes COVID-19. Institutional protocols and algorithms that pertain to the evaluation of patients at risk for COVID-19 are in a state of rapid change based on information released by regulatory bodies including the CDC and federal and state organizations. These policies and algorithms were followed during the patient's care in the ED.  As part of my medical decision making, I reviewed the following data within the electronic MEDICAL RECORD NUMBER Nursing notes reviewed and incorporated, Labs reviewed, notes from prior ED visits and [place] Controlled Substance Database   ____________________________________________   FINAL CLINICAL IMPRESSION(S) / ED DIAGNOSES  Final diagnoses:  Acute pain of right knee      NEW MEDICATIONS STARTED DURING THIS VISIT:  Discharge Medication List as of 12/07/2018 11:24 AM    START taking these medications   Details  HYDROcodone-acetaminophen (NORCO) 5-325 MG tablet Take 1 tablet by mouth every 4 (four) hours as needed for moderate pain., Starting Wed 12/07/2018, Normal    naproxen (NAPROSYN) 500 MG tablet Take 1 tablet (500 mg total) by mouth 2 (two) times daily with a meal for 7 days., Starting Wed 12/07/2018, Until Wed 12/14/2018, Normal         Note:  This  document was prepared using Conservation officer, historic buildingsDragon voice recognition software and may include unintentional dictation errors.    Willy Eddyobinson, Demonie Kassa, MD 12/07/18 1344

## 2018-12-07 NOTE — ED Notes (Addendum)
md at bedside to veiw xrays with patient. Patient discharged to home with follow up referral.

## 2018-12-07 NOTE — ED Notes (Signed)
Radiology to bedside. 

## 2018-12-07 NOTE — ED Notes (Signed)
Assumed care of patient aox4, reports positive relief of tight knee ain with meds given by prior nurse. Vss. Xray results pending.

## 2018-12-07 NOTE — ED Triage Notes (Addendum)
Pt c/o right knee pain with swelling since Friday, denies injury. Pt has a hx of DVT in the same leg

## 2018-12-08 ENCOUNTER — Telehealth: Payer: Self-pay | Admitting: Gerontology

## 2018-12-08 NOTE — Telephone Encounter (Signed)
Called patient, he is ineligible for [facility] due to having insurance

## 2019-01-09 ENCOUNTER — Ambulatory Visit: Payer: BLUE CROSS/BLUE SHIELD | Admitting: Medical

## 2019-01-09 ENCOUNTER — Other Ambulatory Visit: Payer: Self-pay

## 2019-01-09 ENCOUNTER — Encounter: Payer: Self-pay | Admitting: Medical

## 2019-01-09 VITALS — BP 155/101 | HR 85 | Temp 97.6°F | Resp 16 | Wt 278.4 lb

## 2019-01-09 DIAGNOSIS — R300 Dysuria: Secondary | ICD-10-CM

## 2019-01-09 DIAGNOSIS — N3000 Acute cystitis without hematuria: Secondary | ICD-10-CM

## 2019-01-09 DIAGNOSIS — Z113 Encounter for screening for infections with a predominantly sexual mode of transmission: Secondary | ICD-10-CM

## 2019-01-09 DIAGNOSIS — Z114 Encounter for screening for human immunodeficiency virus [HIV]: Secondary | ICD-10-CM

## 2019-01-09 LAB — POCT URINALYSIS DIPSTICK
Bilirubin, UA: NEGATIVE
Blood, UA: NEGATIVE
Glucose, UA: NEGATIVE
Ketones, UA: NEGATIVE
Nitrite, UA: NEGATIVE
Protein, UA: POSITIVE — AB
Spec Grav, UA: <=1.005 — AB (ref 1.010–1.025)
Urobilinogen, UA: 0.2 E.U./dL
pH, UA: 6.5 (ref 5.0–8.0)

## 2019-01-09 MED ORDER — CIPROFLOXACIN HCL 500 MG PO TABS: 500.0000 mg | ORAL_TABLET | Freq: Two times a day (BID) | ORAL | 0 refills | Status: DC

## 2019-01-09 NOTE — Patient Instructions (Addendum)
Hypertension, Adult Hypertension is another name for high blood pressure. High blood pressure forces your heart to work harder to pump blood. This can cause problems over time. There are two numbers in a blood pressure reading. There is a top number (systolic) over a bottom number (diastolic). It is best to have a blood pressure that is below 120/80. Healthy choices can help lower your blood pressure, or you may need medicine to help lower it. What are the causes? The cause of this condition is not known. Some conditions may be related to high blood pressure. What increases the risk?  Smoking.  Having type 2 diabetes mellitus, high cholesterol, or both.  Not getting enough exercise or physical activity.  Being overweight.  Having too much fat, sugar, calories, or salt (sodium) in your diet.  Drinking too much alcohol.  Having long-term (chronic) kidney disease.  Having a family history of high blood pressure.  Age. Risk increases with age.  Race. You may be at higher risk if you are African American.  Gender. Men are at higher risk than women before age 45. After age 65, women are at higher risk than men.  Having obstructive sleep apnea.  Stress. What are the signs or symptoms?  High blood pressure may not cause symptoms. Very high blood pressure (hypertensive crisis) may cause: ? Headache. ? Feelings of worry or nervousness (anxiety). ? Shortness of breath. ? Nosebleed. ? A feeling of being sick to your stomach (nausea). ? Throwing up (vomiting). ? Changes in how you see. ? Very bad chest pain. ? Seizures. How is this treated?  This condition is treated by making healthy lifestyle changes, such as: ? Eating healthy foods. ? Exercising more. ? Drinking less alcohol.  Your health care provider may prescribe medicine if lifestyle changes are not enough to get your blood pressure under control, and if: ? Your top number is above 130. ? Your bottom number is above 80.   Your personal target blood pressure may vary. Follow these instructions at home: Eating and drinking   If told, follow the DASH eating plan. To follow this plan: ? Fill one half of your plate at each meal with fruits and vegetables. ? Fill one fourth of your plate at each meal with whole grains. Whole grains include whole-wheat pasta, brown rice, and whole-grain bread. ? Eat or drink low-fat dairy products, such as skim milk or low-fat yogurt. ? Fill one fourth of your plate at each meal with low-fat (lean) proteins. Low-fat proteins include fish, chicken without skin, eggs, beans, and tofu. ? Avoid fatty meat, cured and processed meat, or chicken with skin. ? Avoid pre-made or processed food.  Eat less than 1,500 mg of salt each day.  Do not drink alcohol if: ? Your doctor tells you not to drink. ? You are pregnant, may be pregnant, or are planning to become pregnant.  If you drink alcohol: ? Limit how much you use to:  0-1 drink a day for women.  0-2 drinks a day for men. ? Be aware of how much alcohol is in your drink. In the [place] one drink equals one 12 oz bottle of beer (355 mL), one 5 oz glass of wine (148 mL), or one 1 oz glass of hard liquor (44 mL). Lifestyle   Work with your doctor to stay at a healthy weight or to lose weight. Ask your doctor what the best weight is for you.  Get at least 30 minutes of exercise most   days of the week. This may include walking, swimming, or biking.  Get at least 30 minutes of exercise that strengthens your muscles (resistance exercise) at least 3 days a week. This may include lifting weights or doing Pilates.  Do not use any products that contain nicotine or tobacco, such as cigarettes, e-cigarettes, and chewing tobacco. If you need help quitting, ask your doctor.  Check your blood pressure at home as told by your doctor.  Keep all follow-up visits as told by your doctor. This is important. Medicines  Take over-the-counter and  prescription medicines only as told by your doctor. Follow directions carefully.  Do not skip doses of blood pressure medicine. The medicine does not work as well if you skip doses. Skipping doses also puts you at risk for problems.  Ask your doctor about side effects or reactions to medicines that you should watch for. Contact a doctor if you:  Think you are having a reaction to the medicine you are taking.  Have headaches that keep coming back (recurring).  Feel dizzy.  Have swelling in your ankles.  Have trouble with your vision. Get help right away if you:  Get a very bad headache.  Start to feel mixed up (confused).  Feel weak or numb.  Feel faint.  Have very bad pain in your: ? Chest. ? Belly (abdomen).  Throw up more than once.  Have trouble breathing. Summary  Hypertension is another name for high blood pressure.  High blood pressure forces your heart to work harder to pump blood.  For most people, a normal blood pressure is less than 120/80.  Making healthy choices can help lower blood pressure. If your blood pressure does not get lower with healthy choices, you may need to take medicine. This information is not intended to replace advice given to you by your health care provider. Make sure you discuss any questions you have with your health care provider. Document Released: 07/22/2007 Document Revised: 10/13/2017 Document Reviewed: 10/13/2017 Elsevier Patient Education  2020 [employer]. Gonorrhea Gonorrhea is a sexually transmitted disease (STD) that can affect both men and women. If left untreated, this infection can: Damage the male or male organs. Cause women and men to be unable to have children (be sterile). Harm a fetus if an infected woman is pregnant. It is important to get treatment for gonorrhea as soon as possible. It is also necessary for all of your sexual partners to be tested for the infection. What are the causes? This condition is  caused by bacteria called Neisseria gonorrhoeae. The infection is spread from person to person through sexual contact, including oral, anal, and vaginal sex. A newborn can contract the infection from his or her mother during birth. What increases the risk? The following factors may make you more likely to develop this condition: Being a woman who is younger than 34 years of age and who is sexually active. Being a woman 75 years of age or older who has: A new sex partner. More than one sex partner. A sex partner who has an STD. Being a man who has: A new sex partner. More than one sex partner. A sex partner who has an STD. Using condoms inconsistently. Currently having, or having previously had, an STD. Exchanging sex for money or drugs. What are the signs or symptoms? Some people do not have any symptoms. If you do have symptoms, they may be different for females and males. For females Pain in the lower abdomen. Abnormal vaginal  discharge. The discharge may be cloudy, thick, or yellow-green in color. Bleeding between periods. Painful sex. Burning or itching in and around the vagina. Pain or burning when urinating. Irritation, pain, bleeding, or discharge from the rectum. This may occur if the infection was spread by anal sex. Sore throat or swollen lymph nodes in the neck. This may occur if the infection was spread by oral sex. For males Abnormal discharge from the penis. This discharge may be cloudy, thick, or yellow-green in color. Pain or burning during urination. Pain or swelling in the testicles. Irritation, pain, bleeding, or discharge from the rectum. This may occur if the infection was spread by anal sex. Sore throat, fever, or swollen lymph nodes in the neck. This may occur if the infection was spread by oral sex. How is this diagnosed? This condition is diagnosed based on: A physical exam. A sample of discharge that is examined under a microscope to look for the bacteria.  The discharge may be taken from the urethra, cervix, throat, or rectum. Urine tests. Not all of test results will be available during your visit. How is this treated? This condition is treated with antibiotic medicines. It is important for treatment to begin as soon as possible. Early treatment may prevent some problems from developing. Do not have sex during treatment. Avoid all types of sexual activity for 7 days after treatment is complete and until any sex partners have been treated. Follow these instructions at home: Take over-the-counter and prescription medicines only as told by your health care provider. Take your antibiotic medicine as told by your health care provider. Do not stop taking the antibiotic even if you start to feel better. Do not have sex until at least 7 days after you and your partner(s) have finished treatment and your health care provider says it is okay. It is your responsibility to get your test results. Ask your health care provider, or the department performing the test, when your results will be ready. If you test positive for gonorrhea, inform your recent sexual partners. This includes any oral, anal, or vaginal sex partners. They need to be checked for gonorrhea even if they do not have symptoms. They may need treatment, even if they test negative for gonorrhea. Keep all follow-up visits as told by your health care provider. This is important. How is this prevented?  Use latex condoms correctly every time you have sexual intercourse. Ask if your sexual partner has been tested for STDs and had negative results. Avoid having multiple sexual partners. Contact a health care provider if: You develop a bad reaction to the medicine you were prescribed. This may include: A rash. Nausea. Vomiting. Diarrhea. Your symptoms do not get better after a few days of taking antibiotics. Your symptoms get worse. You develop new symptoms. Your pain gets worse. You have a  fever. You develop pain, itching, or discharge around the eyes. Get help right away if: You feel dizzy or faint. You have trouble breathing or have shortness of breath. You develop an irregular heartbeat. You have severe abdominal pain with or without shoulder pain. You develop any bumps or sores (lesions) on your skin. You develop warmth, redness, pain, or swelling around your joints, such as the knee. Summary Gonorrhea is an STD that can affect both men and women. This condition is caused by bacteria called Neisseria gonorrhoeae. The infection is spread from person to person, usually through sexual contact, including oral, anal, and vaginal sex. Symptoms vary between males and  females. Generally, they include abnormal discharge and burning during urination. Women may also experience painful sex, itching around the vagina, and bleeding between menstrual periods. Men may also experience swelling of the testicles. This condition is treated with antibiotic medicines. Do not have sex until at least 7 days after completing antibiotic treatment. If left untreated, gonorrhea can have serious side effects and complications. This information is not intended to replace advice given to you by your health care provider. Make sure you discuss any questions you have with your health care provider. Document Released: 01/31/2000 Document Revised: 03/11/2018 Document Reviewed: 01/03/2016 Elsevier Patient Education  2020 [employer] Chlamydia, Male  Chlamydia is an STD (sexually transmitted disease). It is a bacterial infection that spreads through sexual contact (is contagious). Chlamydia can occur in different areas of the body, including the tube that moves urine from the bladder out of the body (urethra), the throat, or the rectum. This condition is not difficult to treat. However, if left untreated, chlamydia can lead to more serious health problems. What are the causes? Chlamydia is caused by the  bacteria Chlamydia trachomatis. It is passed from an infected partner during sexual activity. Chlamydia can spread through contact with the genitals, mouth, or rectum. What are the signs or symptoms? In some cases, there may not be any symptoms for this condition (asymptomatic), especially early in the infection. If symptoms develop, they may include:  Burning when urinating.  Urinating frequently.  Pain or swelling in the testicles.  Watery, mucus-like discharge from the penis.  Redness, soreness, and swelling (inflammation) of the rectum.  Bleeding or discharge from the rectum.  Abdominal pain.  Itching, burning, or redness in the eyes, or discharge from the eyes. How is this diagnosed? This condition may be diagnosed based on:  Urine tests.  Swab tests. Depending on your symptoms, your health care provider may use a cotton swab to collect discharge from your urethra or rectum to test for the bacteria. How is this treated? This condition is treated with antibiotic medicines. Follow these instructions at home: Medicines  Take over-the-counter and prescription medicines only as told by your health care provider.  Take your antibiotic medicine as told by your health care provider. Do not stop taking the antibiotic even if you start to feel better. Sexual activity  Tell sexual partners about your infection. This includes any oral, anal, or vaginal sex partners you have had within 60 days of when your symptoms started. Sexual partners should also be treated, even if they have no signs of the disease.  Do not have sex until you and your sexual partners have completed treatment and your health care provider says it is okay. If your health care provider prescribed you a single dose treatment, wait 7 days after taking the treatment before having sex. General instructions  It is your responsibility to get your test results. Ask your health care provider, or the department performing  the test, when your results will be ready.  Get plenty of rest.  Eat a healthy, well-balanced diet.  Drink enough fluids to keep your urine clear or pale yellow.  Keep all follow-up visits as told by your health care provider. This is important. You may need to be tested for infection again 3 months after treatment. How is this prevented? The only sure way to prevent chlamydia is to avoid sexual intercourse. However, you can lower your risk by:  Using latex condoms correctly every time you have sexual intercourse.  Not having  multiple sexual partners.  Asking if your sexual partner has been tested for STIs and had negative results. Contact a health care provider if:  You develop new symptoms or your symptoms do not get better after completing treatment.  You have a fever or chills.  You have pain during sexual intercourse.  You develop new joint pain or swelling near your joints.  You have pain or soreness in your testicles. Get help right away if:  Your pain gets worse and does not get better with medicine.  You have abnormal discharge.  You develop flu-like symptoms, such as night sweats, sore throat, or muscle aches. Summary  Chlamydia is an STD (sexually transmitted disease). It is a bacterial infection that spreads (is contagious) through sexual contact.  This condition is not difficult to treat, however, if left untreated, it can lead to more serious health problems.  In some cases, there may not be any symptoms for this condition (asymptomatic).  This condition is treated with antibiotic medicines.  Using latex condoms correctly every time you have sexual intercourse can help prevent chlamydia. This information is not intended to replace advice given to you by your health care provider. Make sure you discuss any questions you have with your health care provider. Document Released: 02/02/2005 Document Revised: 02/17/2017 Document Reviewed: 01/20/2016 Elsevier  Patient Education  2020 [employer] Prostatitis  Prostatitis is swelling or inflammation of the prostate gland. The prostate is a walnut-sized gland that is involved in the production of semen. It is located below a man's bladder, in front of the rectum. There are four types of prostatitis:  Chronic nonbacterial prostatitis. This is the most common type of prostatitis. It may be associated with a viral infection or autoimmune disorder.  Acute bacterial prostatitis. This is the least common type of prostatitis. It starts quickly and is usually associated with a bladder infection, high fever, and shaking chills. It can occur at any age.  Chronic bacterial prostatitis. This type usually results from acute bacterial prostatitis that happens repeatedly (is recurrent) or has not been treated properly. It can occur in men of any age but is most common among middle-aged men whose prostate has begun to get larger. The symptoms are not as severe as symptoms caused by acute bacterial prostatitis.  Prostatodynia or chronic pelvic pain syndrome (CPPS). This type is also called pelvic floor disorder. It is associated with increased muscular tone in the pelvis surrounding the prostate. What are the causes? Bacterial prostatitis is caused by infection from bacteria. Chronic nonbacterial prostatitis may be caused by:  Urinary tract infections (UTIs).  Nerve damage.  A response by the body's disease-fighting system (autoimmune response).  Chemicals in the urine. The causes of the other types of prostatitis are usually not known. What are the signs or symptoms? Symptoms of this condition vary depending upon the type of prostatitis. If you have acute bacterial prostatitis, you may experience:  Urinary symptoms, such as: ? Painful urination. ? Burning during urination. ? Frequent and sudden urges to urinate. ? Inability to start urinating. ? A weak or interrupted stream of urine.  Vomiting.   Nausea.  Fever.  Chills.  Inability to empty the bladder completely.  Pain in the: ? Muscles or joints. ? Lower back. ? Lower abdomen. If you have any of the other types of prostatitis, you may experience:  Urinary symptoms, such as: ? Sudden urges to urinate. ? Frequent urination. ? Difficulty starting urination. ? Weak urine stream. ? Dribbling after urination.  Discharge from the urethra. The urethra is a tube that opens at the end of the penis.  Pain in the: ? Testicles. ? Penis or tip of the penis. ? Rectum. ? Area in front of the rectum and below the scrotum (perineum).  Problems with sexual function.  Painful ejaculation.  Bloody semen. How is this diagnosed? This condition may be diagnosed based on:  A physical and medical exam.  Your symptoms.  A urine test to check for bacteria.  An exam in which a health care provider uses a finger to feel the prostate (digital rectal exam).  A test of a sample of semen.  Blood tests.  Ultrasound.  Removal of prostate tissue to be examined under a microscope (biopsy).  Tests to check how your body handles urine (urodynamic tests).  A test to look inside your bladder or urethra (cystoscopy). How is this treated? Treatment for this condition depends on the type of prostatitis. Treatment may involve:  Medicines to relieve pain or inflammation.  Medicines to help relax your muscles.  Physical therapy.  Heat therapy.  Techniques to help you control certain body functions (biofeedback).  Relaxation exercises.  Antibiotic medicine, if your condition is caused by bacteria.  Warm water baths (sitz baths). Sitz baths help with relaxing your pelvic floor muscles, which helps to relieve pressure on the prostate. Follow these instructions at home:   Take over-the-counter and prescription medicines only as told by your health care provider.  If you were prescribed an antibiotic, take it as told by your  health care provider. Do not stop taking the antibiotic even if you start to feel better.  If physical therapy, biofeedback, or relaxation exercises were prescribed, do exercises as instructed.  Take sitz baths as directed by your health care provider. For a sitz bath, sit in warm water that is deep enough to cover your hips and buttocks.  Keep all follow-up visits as told by your health care provider. This is important. Contact a health care provider if:  Your symptoms get worse.  You have a fever. Get help right away if:  You have chills.  You feel nauseous.  You vomit.  You feel light-headed or feel like you are going to faint.  You are unable to urinate.  You have blood or blood clots in your urine. This information is not intended to replace advice given to you by your health care provider. Make sure you discuss any questions you have with your health care provider. Document Released: 01/31/2000 Document Revised: 04/17/2017 Document Reviewed: 10/24/2015 Elsevier Patient Education  2020 [employer].

## 2019-01-09 NOTE — Addendum Note (Signed)
Addended by: RATCLIFFE, Nira Conn R on: 01/09/2019 02:16 PM   Modules accepted: Orders

## 2019-01-09 NOTE — Progress Notes (Signed)
Subjective:    Patient ID: Stuart Mclaughlin, male    DOB: 05/30/1985, 33 y.o.   MRN: [id number]  HPI  33 yo male in non acute distress. Wed or Thursday stinging on urnation, , yesterday discharge, both thin and thick, white. If not urinating does not hurt or itchy.  No prior history of STD. No history of UTI. Denies fever, chills back pain right side 6/10, lifts heavy stuff for work. Better now.   No known exposure,  Recent unprotected sex. With regular partner. longtern male partner. no rectal sexual intercourse.. No known other partners.  Review of Systems  Constitutional: Negative for chills and fever.  Gastrointestinal: Negative for abdominal pain, anal bleeding, blood in stool, diarrhea, nausea and vomiting.  Genitourinary: Positive for discharge, dysuria and flank pain. Negative for decreased urine volume, difficulty urinating, frequency, genital sores, hematuria, penile pain, penile swelling, scrotal swelling, testicular pain and urgency.  Musculoskeletal: Negative for back pain.   No Known Allergies Current Outpatient Medications on File Prior to Visit  Medication Sig Dispense Refill  . ibuprofen (ADVIL) 600 MG tablet Take 1 tablet (600 mg total) by mouth every 8 (eight) hours as needed. 15 tablet 0   No current facility-administered medications on file prior to visit.        Objective:   Physical Exam Vitals signs and nursing note reviewed.  Constitutional:      Appearance: He is obese.  HENT:     Head: Normocephalic and atraumatic.  Eyes:     Extraocular Movements: Extraocular movements intact.     Conjunctiva/sclera: Conjunctivae normal.     Pupils: Pupils are equal, round, and reactive to light.  Genitourinary:    Penis: Normal.      Scrotum/Testes: Normal.     Prostate: Normal.     Rectum: Normal. Guaiac result negative.     Comments: Clear thin discharge Musculoskeletal: Normal range of motion.  Skin:    General: Skin is warm and dry.  Neurological:      General: No focal deficit present.     Mental Status: He is alert and oriented to person, place, and time. Mental status is at baseline.  Psychiatric:     Comments: anxious     Recent Results (from the past 2160 hour(s))  POCT Urinalysis Dipstick     Status: Abnormal   Collection Time: 01/09/19  1:36 PM  Result Value Ref Range   Color, UA straw    Clarity, UA cloudy    Glucose, UA Negative Negative   Bilirubin, UA neg    Ketones, UA neg    Spec Grav, UA <=1.005 (A) 1.010 - 1.025   Blood, UA neg    pH, UA 6.5 5.0 - 8.0   Protein, UA Positive (A) Negative    Comment: trace   Urobilinogen, UA 0.2 0.2 or 1.0 E.U./dL   Nitrite, UA neg    Leukocytes, UA Large (3+) (A) Negative   Appearance     Odor +        Assessment & Plan:  Prostattis vs Chlamydia vs UTI Treated with   Meds ordered this encounter  Medications  . ciprofloxacin (CIPRO) 500 MG tablet    Sig: Take 1 tablet (500 mg total) by mouth 2 (two) times daily.    Dispense:  14 tablet    Refill:  0   Orders Placed This Encounter  Procedures  . Chlamydia/Gonococcus/Trichomonas, NAA  . Urine culture  . POCT Urinalysis Dipstick   Reviewed STDs  with patient, to take Cipro in the mean time Will contact with results. Encouraged safe sex with a condom till we know for sure. Given information/educational material on hypertension.  Patient verbalizes understanding and has no questions at discharge.

## 2019-01-10 LAB — URINALYSIS, ROUTINE W REFLEX MICROSCOPIC
Bilirubin, UA: NEGATIVE
Glucose, UA: NEGATIVE
Ketones, UA: NEGATIVE
Nitrite, UA: NEGATIVE
RBC, UA: NEGATIVE
Specific Gravity, UA: 1.022 (ref 1.005–1.030)
Urobilinogen, Ur: 1 mg/dL (ref 0.2–1.0)
pH, UA: 8.5 — ABNORMAL HIGH (ref 5.0–7.5)

## 2019-01-10 LAB — MICROSCOPIC EXAMINATION
Bacteria, UA: NONE SEEN
Casts: NONE SEEN /lpf
Epithelial Cells (non renal): NONE SEEN /hpf (ref 0–10)
RBC, Urine: NONE SEEN /hpf (ref 0–2)
WBC, UA: >30 /hpf — AB (ref 0–5)

## 2019-01-11 ENCOUNTER — Ambulatory Visit: Payer: BLUE CROSS/BLUE SHIELD | Admitting: Medical

## 2019-01-11 ENCOUNTER — Other Ambulatory Visit: Payer: Self-pay

## 2019-01-11 VITALS — Temp 97.7°F

## 2019-01-11 DIAGNOSIS — Z202 Contact with and (suspected) exposure to infections with a predominantly sexual mode of transmission: Secondary | ICD-10-CM

## 2019-01-11 LAB — URINE CULTURE

## 2019-01-11 MED ORDER — CEFTRIAXONE SODIUM 1 G IJ SOLR
1.0000 g | Freq: Once | INTRAMUSCULAR | Status: AC
  Administered 2019-01-11: 1 g via INTRAMUSCULAR

## 2019-01-11 MED ORDER — AZITHROMYCIN 250 MG PO TABS: ORAL_TABLET | ORAL | 0 refills | Status: DC

## 2019-01-11 NOTE — Progress Notes (Signed)
Due to Thanksgiving holiday, discussed with patient if he is postive for GC/chlamydia that he would need and injection of Rocephin.    I am concerned due to the holiday the access of getting medication at an urgent care or the Emergency Department.  He states he has had improvemt of Discharge from penis, no pain with urination now..  Treated for GC and Chlamydia Stop Cipro take Azithromycin as prescribed.  Meds ordered this encounter  Medications  . cefTRIAXone (ROCEPHIN) injection 1 g  . azithromycin (ZITHROMAX) 250 MG tablet    Sig: Take all pills at the same time, take with food.    Dispense:  4 tablet    Refill:  0  Protection for 7 days during sexual intercourse. To notify all partners if positive results. Awaiting test results. Will call patient once I get the results.monitored for  15 min after injection, tolerated injection well. He verbalizes understanding and has no questions at discharge.

## 2019-01-12 LAB — CHLAMYDIA/GONOCOCCUS/TRICHOMONAS, NAA
Chlamydia by NAA: NEGATIVE
Gonococcus by NAA: POSITIVE — AB
Trich vag by NAA: NEGATIVE

## 2019-01-16 ENCOUNTER — Telehealth: Payer: Self-pay | Admitting: Medical

## 2019-01-16 NOTE — Telephone Encounter (Signed)
  Called patient and informed him of his results. His test results were positive for Gonoccus (GC). He received treatment with Rocephin and Azithromycin. On 01/11/2019. He states his symptoms have resolved.  Reviewed with the patient to communicate results to all  partners. He verbalizes understanding and has no questions at the end of our conversation.

## 2019-01-19 ENCOUNTER — Encounter: Payer: Self-pay | Admitting: Medical

## 2019-03-16 ENCOUNTER — Telehealth: Payer: Self-pay

## 2019-03-16 NOTE — Telephone Encounter (Signed)
Please advise 

## 2019-03-16 NOTE — Telephone Encounter (Signed)
Patient was scheduled 03/29/2019.

## 2019-03-16 NOTE — Telephone Encounter (Signed)
Ok but it will be at least February before I can see him. No openings today

## 2019-03-16 NOTE — Telephone Encounter (Signed)
Copied from CRM [id number]. Topic: Appointment Scheduling - Scheduling Inquiry for Clinic >> Mar 16, 2019  9:15 AM Crist Infante wrote: Reason for CRM: Stuart Mclaughlin, and grandmother Stuart Mclaughlin are pts of Dr Sullivan Lone.  Mom wants to know if Dr Sullivan Lone will take on her son, this pt, as a pt.  She states he is having bp issues and needs a dr.

## 2019-03-29 ENCOUNTER — Ambulatory Visit (INDEPENDENT_AMBULATORY_CARE_PROVIDER_SITE_OTHER): Payer: BC Managed Care – PPO | Admitting: Family Medicine

## 2019-03-29 ENCOUNTER — Other Ambulatory Visit: Payer: Self-pay

## 2019-03-29 ENCOUNTER — Encounter: Payer: Self-pay | Admitting: Family Medicine

## 2019-03-29 VITALS — BP 156/84 | HR 65 | Temp 97.1°F | Resp 16 | Ht 76.0 in | Wt 283.0 lb

## 2019-03-29 DIAGNOSIS — G4733 Obstructive sleep apnea (adult) (pediatric): Secondary | ICD-10-CM | POA: Diagnosis not present

## 2019-03-29 DIAGNOSIS — Z0000 Encounter for general adult medical examination without abnormal findings: Secondary | ICD-10-CM | POA: Diagnosis not present

## 2019-03-29 DIAGNOSIS — I10 Essential (primary) hypertension: Secondary | ICD-10-CM

## 2019-03-29 LAB — POCT URINALYSIS DIPSTICK
Bilirubin, UA: NEGATIVE
Blood, UA: NEGATIVE
Glucose, UA: NEGATIVE
Ketones, UA: NEGATIVE
Leukocytes, UA: NEGATIVE
Nitrite, UA: NEGATIVE
Protein, UA: NEGATIVE
Spec Grav, UA: 1.02 (ref 1.010–1.025)
Urobilinogen, UA: 0.2 E.U./dL
pH, UA: 6 (ref 5.0–8.0)

## 2019-03-29 MED ORDER — AMLODIPINE BESYLATE 5 MG PO TABS: 5.0000 mg | ORAL_TABLET | Freq: Every day | ORAL | 3 refills | Status: DC

## 2019-03-29 NOTE — Progress Notes (Signed)
Patient: Stuart Mclaughlin, Male    DOB: September 05, 1985, 34 y.o.   MRN: [id number] Visit Date: 03/29/2019  Today's Provider: Megan Mans, MD   Chief Complaint  Patient presents with  . Establish Care  . Annual Exam   Subjective:     Annual physical exam Stuart Mclaughlin is a 34 y.o. male who presents today for health maintenance and complete physical. He feels well. He reports exercising yes. He reports he is sleeping well. 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 -----------------------------------------------------------   Review of Systems  Constitutional: Negative.   HENT: Negative.   Eyes: Negative.   Respiratory: Negative.   Cardiovascular: Negative.   Endocrine: Negative.   Genitourinary: Negative.   Musculoskeletal: Negative.   Allergic/Immunologic: Negative.   Neurological: Negative.   Hematological: Negative.   Psychiatric/Behavioral: Negative.     Social History      He  reports that he has been smoking cigarettes. He has never used smokeless tobacco. He reports current alcohol use of about 2.0 standard drinks of alcohol per week. He reports that he does not use drugs.       Social History   Socioeconomic History  . Marital status: Single    Spouse name: Not on file  . Number of children: Not on file  . Years of education: Not on file  . Highest education level: Not on file  Occupational History  . Not on file  Tobacco Use  . Smoking status: Smoker, Current Status Unknown    Types: Cigarettes  . Smokeless tobacco: Never Used  . Tobacco comment: 2-3 ciggs per day per pt   Substance and Sexual Activity  . Alcohol use: Yes    Alcohol/week: 2.0 standard drinks    Types: 2 Cans of beer per week  . Drug use: No  . Sexual activity: Not on file  Other Topics Concern  . Not on file  Social History Narrative  . Not on file   Social Determinants of Health   Financial Resource Strain:   . Difficulty of Paying Living  Expenses: Not on file  Food Insecurity:   . Worried About Programme researcher, broadcasting/film/video in the Last Year: Not on file  . Ran Out of Food in the Last Year: Not on file  Transportation Needs:   . Lack of Transportation (Medical): Not on file  . Lack of Transportation (Non-Medical): Not on file  Physical Activity:   . Days of Exercise per Week: Not on file  . Minutes of Exercise per Session: Not on file  Stress:   . Feeling of Stress : Not on file  Social Connections:   . Frequency of Communication with Friends and Family: Not on file  . Frequency of Social Gatherings with Friends and Family: Not on file  . Attends Religious Services: Not on file  . Active Member of Clubs or Organizations: Not on file  . Attends Banker Meetings: Not on file  . Marital Status: Not on file    Past Medical History:  Diagnosis Date  . Allergy   . Arthritis   . Deep vein thrombosis (DVT) (HCC)      There are no problems to display for this patient.   Past Surgical History:  Procedure Laterality Date  . PERIPHERAL VASCULAR CATHETERIZATION  04/01/2015   Procedure: Lower Extremity Venography;  Surgeon: Annice Needy, MD;  Location: [facility]IVE [facility];  Service: Cardiovascular;;  .  PERIPHERAL VASCULAR CATHETERIZATION Right 04/01/2015   Procedure: Lower Extremity Intervention;  Surgeon: Annice Needy, MD;  Location: [facility];  Service: Cardiovascular;  Laterality: Right;  . PERIPHERAL VASCULAR CATHETERIZATION N/A 04/01/2015   Procedure: IVC Filter Insertion;  Surgeon: Annice Needy, MD;  Location: [facility];  Service: Cardiovascular;  Laterality: N/A;    Family History        No family status information on file.        His family history is not on file.      No Known Allergies   Current Outpatient Medications:  .  ibuprofen (ADVIL) 600 MG tablet, Take 1 tablet (600 mg total) by mouth every 8 (eight) hours as needed., Disp: 15 tablet, Rfl: 0 .  azithromycin (ZITHROMAX) 250  MG tablet, Take all pills at the same time, take with food. (Patient not taking: Reported on 03/29/2019), Disp: 4 tablet, Rfl: 0 .  ciprofloxacin (CIPRO) 500 MG tablet, Take 1 tablet (500 mg total) by mouth 2 (two) times daily. (Patient not taking: Reported on 03/29/2019), Disp: 14 tablet, Rfl: 0   Patient Care Team: Maple Hudson., MD as PCP - General (Family Medicine)    Objective:    Vitals: BP (!) 156/84 (BP Location: Left Arm, Patient Position: Sitting, Cuff Size: Large)   Pulse 65   Temp (!) 97.1 F (36.2 C) (Other (Comment))   Resp 16   Ht 6\' 4"  (1.93 m)   Wt 283 lb (128.4 kg)   SpO2 97%   BMI 34.45 kg/m    Vitals:   03/29/19 1512  BP: (!) 156/84  Pulse: 65  Resp: 16  Temp: (!) 97.1 F (36.2 C)  TempSrc: Other (Comment)  SpO2: 97%  Weight: 283 lb (128.4 kg)  Height: 6\' 4"  (1.93 m)     Physical Exam Vitals reviewed.  Constitutional:      Appearance: He is obese.  HENT:     Head: Normocephalic and atraumatic.     Right Ear: Tympanic membrane and external ear normal.     Left Ear: Tympanic membrane and external ear normal.     Nose: Nose normal.     Mouth/Throat:     Pharynx: Oropharynx is clear.  Eyes:     General: No scleral icterus.    Conjunctiva/sclera: Conjunctivae normal.  Cardiovascular:     Rate and Rhythm: Normal rate and regular rhythm.     Pulses: Normal pulses.     Heart sounds: Normal heart sounds.  Pulmonary:     Effort: Pulmonary effort is normal.     Breath sounds: Normal breath sounds.  Abdominal:     Palpations: Abdomen is soft.  Genitourinary:    Penis: Normal.      Testes: Normal.  Lymphadenopathy:     Cervical: No cervical adenopathy.  Skin:    General: Skin is warm and dry.  Neurological:     General: No focal deficit present.     Mental Status: He is alert and oriented to person, place, and time.  Psychiatric:        Mood and Affect: Mood normal.        Behavior: Behavior normal.        Thought Content: Thought  content normal.        Judgment: Judgment normal.      Depression Screen PHQ 2/9 Scores 03/29/2019  PHQ - 2 Score 0  PHQ- 9 Score 0       Assessment &  Plan:     Routine Health Maintenance and Physical Exam  Exercise Activities and Dietary recommendations Goals   None     Immunization History  Administered Date(s) Administered  . Tdap 11/11/2017    Health Maintenance  Topic Date Due  . HIV Screening  04/30/1999  . INFLUENZA VACCINE  09/17/2018  . TETANUS/TDAP  11/12/2027     Discussed health benefits of physical activity, and encouraged him to engage in regular exercise appropriate for his age and condition.    --------------------------------------------------------------------  1. Annual physical exam  - POCT urinalysis dipstick-Normal - TSH - Lipid panel - CBC w/Diff/Platelet - Comprehensive Metabolic Panel (CMET)  2. Hypertension, unspecified type Start amLODipine (NORVASC) 5 MG tablet daily.  - TSH - Lipid panel - CBC w/Diff/Platelet - Comprehensive Metabolic Panel (CMET) - amLODipine (NORVASC) 5 MG tablet; Take 1 tablet (5 mg total) by mouth daily.  Dispense: 30 tablet; Refill: 3  3. OSA (obstructive sleep apnea) Epworth of 16. - Home sleep test  Follow up in 1-2 months. 4.Obesity    I,Roald Lukacs,acting as a scribe for Wilhemena Durie, MD.,have documented all relevant documentation on the behalf of Wilhemena Durie, MD,as directed by  Wilhemena Durie, MD while in the presence of Wilhemena Durie, MD.    Wilhemena Durie, MD  [facility]

## 2019-03-30 DIAGNOSIS — I10 Essential (primary) hypertension: Secondary | ICD-10-CM | POA: Diagnosis not present

## 2019-03-30 DIAGNOSIS — Z0000 Encounter for general adult medical examination without abnormal findings: Secondary | ICD-10-CM | POA: Diagnosis not present

## 2019-03-31 LAB — COMPREHENSIVE METABOLIC PANEL
ALT: 75 IU/L — ABNORMAL HIGH (ref 0–44)
AST: 43 IU/L — ABNORMAL HIGH (ref 0–40)
Albumin/Globulin Ratio: 1.6 (ref 1.2–2.2)
Albumin: 4.4 g/dL (ref 4.0–5.0)
Alkaline Phosphatase: 75 IU/L (ref 39–117)
BUN/Creatinine Ratio: 7 — ABNORMAL LOW (ref 9–20)
BUN: 9 mg/dL (ref 6–20)
Bilirubin Total: 0.5 mg/dL (ref 0.0–1.2)
CO2: 21 mmol/L (ref 20–29)
Calcium: 9.6 mg/dL (ref 8.7–10.2)
Chloride: 101 mmol/L (ref 96–106)
Creatinine, Ser: 1.25 mg/dL (ref 0.76–1.27)
GFR calc Af Amer: 86 mL/min/{1.73_m2} (ref >59)
GFR calc non Af Amer: 75 mL/min/{1.73_m2} (ref >59)
Globulin, Total: 2.8 g/dL (ref 1.5–4.5)
Glucose: 90 mg/dL (ref 65–99)
Potassium: 4.2 mmol/L (ref 3.5–5.2)
Sodium: 140 mmol/L (ref 134–144)
Total Protein: 7.2 g/dL (ref 6.0–8.5)

## 2019-03-31 LAB — CBC WITH DIFFERENTIAL/PLATELET
Basophils Absolute: 0 10*3/uL (ref 0.0–0.2)
Basos: 1 %
EOS (ABSOLUTE): 0.1 10*3/uL (ref 0.0–0.4)
Eos: 2 %
Hematocrit: 46.7 % (ref 37.5–51.0)
Hemoglobin: 16.2 g/dL (ref 13.0–17.7)
Immature Grans (Abs): 0 10*3/uL (ref 0.0–0.1)
Immature Granulocytes: 0 %
Lymphocytes Absolute: 2.7 10*3/uL (ref 0.7–3.1)
Lymphs: 53 %
MCH: 32.5 pg (ref 26.6–33.0)
MCHC: 34.7 g/dL (ref 31.5–35.7)
MCV: 94 fL (ref 79–97)
Monocytes Absolute: 0.4 10*3/uL (ref 0.1–0.9)
Monocytes: 7 %
Neutrophils Absolute: 1.9 10*3/uL (ref 1.4–7.0)
Neutrophils: 37 %
Platelets: 224 10*3/uL (ref 150–450)
RBC: 4.98 x10E6/uL (ref 4.14–5.80)
RDW: 13.6 % (ref 11.6–15.4)
WBC: 5.2 10*3/uL (ref 3.4–10.8)

## 2019-03-31 LAB — LIPID PANEL
Chol/HDL Ratio: 4.6 ratio (ref 0.0–5.0)
Cholesterol, Total: 225 mg/dL — ABNORMAL HIGH (ref 100–199)
HDL: 49 mg/dL (ref >39)
LDL Chol Calc (NIH): 148 mg/dL — ABNORMAL HIGH (ref 0–99)
Triglycerides: 156 mg/dL — ABNORMAL HIGH (ref 0–149)
VLDL Cholesterol Cal: 28 mg/dL (ref 5–40)

## 2019-03-31 LAB — TSH: TSH: 1.74 u[IU]/mL (ref 0.450–4.500)

## 2019-04-03 ENCOUNTER — Other Ambulatory Visit: Payer: Self-pay

## 2019-04-03 ENCOUNTER — Emergency Department
Admission: EM | Admit: 2019-04-03 | Discharge: 2019-04-03 | Disposition: A | Discharge status: Home / Self Care | Payer: BC Managed Care – PPO | Attending: Student | Admitting: Student

## 2019-04-03 DIAGNOSIS — K644 Residual hemorrhoidal skin tags: Secondary | ICD-10-CM | POA: Diagnosis not present

## 2019-04-03 DIAGNOSIS — Z79899 Other long term (current) drug therapy: Secondary | ICD-10-CM | POA: Diagnosis not present

## 2019-04-03 DIAGNOSIS — K625 Hemorrhage of anus and rectum: Secondary | ICD-10-CM | POA: Diagnosis not present

## 2019-04-03 DIAGNOSIS — F17210 Nicotine dependence, cigarettes, uncomplicated: Secondary | ICD-10-CM | POA: Diagnosis not present

## 2019-04-03 MED ORDER — LIDOCAINE 0.5 % EX GEL: 1.0000 "application " | Freq: Two times a day (BID) | CUTANEOUS | 0 refills | Status: DC

## 2019-04-03 MED ORDER — DOCUSATE SODIUM 100 MG PO CAPS: 100.0000 mg | ORAL_CAPSULE | Freq: Two times a day (BID) | ORAL | 0 refills | Status: AC

## 2019-04-03 MED ORDER — HYDROCORTISONE ACETATE 25 MG RE SUPP: 25.0000 mg | Freq: Two times a day (BID) | RECTAL | 1 refills | Status: AC

## 2019-04-03 NOTE — Discharge Instructions (Signed)
Follow discharge care instruction take medication as directed. °

## 2019-04-03 NOTE — ED Notes (Signed)
See triage note  Presents with some rectal pain  And bleeding  States he thinks that his hemorrhoid "busted"

## 2019-04-03 NOTE — ED Triage Notes (Signed)
Pt states he had a flare up with his hemorrhoids over the weekend and is having some bleeding from them. Pt is a/ox4 in NAD.

## 2019-04-03 NOTE — ED Provider Notes (Signed)
[facility] Emergency Department Provider Note   ____________________________________________   First MD Initiated Contact with Patient 04/03/19 1359     (approximate)  I have reviewed the triage vital signs and the nursing notes.   HISTORY  Chief Complaint Hemorrhoids    HPI SAKSHAM AKKERMAN is a 34 y.o. male patient complaining of pain and mild rectal bleeding from  hemorrhoids.  Patient states bowel movements are difficult at this time.  Last episode of bleeding was last night.  Had a bowel movement this morning with no bleeding.  Patient rates his pain as a 4/10.  Patient described the pain as "achy".  No relief with cream that he purchased over-the-counter last week.      Past Medical History:  Diagnosis Date  . Allergy   . Arthritis   . Deep vein thrombosis (DVT) (HCC)     There are no problems to display for this patient.   Past Surgical History:  Procedure Laterality Date  . PERIPHERAL VASCULAR CATHETERIZATION  04/01/2015   Procedure: Lower Extremity Venography;  Surgeon: Annice Needy, MD;  Location: [facility];  Service: Cardiovascular;;  . PERIPHERAL VASCULAR CATHETERIZATION Right 04/01/2015   Procedure: Lower Extremity Intervention;  Surgeon: Annice Needy, MD;  Location: [facility];  Service: Cardiovascular;  Laterality: Right;  . PERIPHERAL VASCULAR CATHETERIZATION N/A 04/01/2015   Procedure: IVC Filter Insertion;  Surgeon: Annice Needy, MD;  Location: [facility];  Service: Cardiovascular;  Laterality: N/A;    Prior to Admission medications   Medication Sig Start Date End Date Taking? Authorizing Provider  amLODipine (NORVASC) 5 MG tablet Take 1 tablet (5 mg total) by mouth daily. 03/29/19   Maple Hudson., MD  docusate sodium (COLACE) 100 MG capsule Take 1 capsule (100 mg total) by mouth 2 (two) times daily. 04/03/19 04/02/20  Joni Reining, PA-C  hydrocortisone (ANUSOL-HC) 25 MG suppository Place  1 suppository (25 mg total) rectally every 12 (twelve) hours. 04/03/19 04/02/20  Joni Reining, PA-C  ibuprofen (ADVIL) 600 MG tablet Take 1 tablet (600 mg total) by mouth every 8 (eight) hours as needed. 11/24/18   Joni Reining, PA-C  Lidocaine 0.5 % GEL Apply 1 application topically in the morning and at bedtime. 04/03/19   Joni Reining, PA-C    Allergies Patient has no known allergies.  No family history on file.  Social History Social History   Tobacco Use  . Smoking status: Smoker, Current Status Unknown    Types: Cigarettes  . Smokeless tobacco: Never Used  . Tobacco comment: 2-3 ciggs per day per pt   Substance Use Topics  . Alcohol use: Yes    Alcohol/week: 2.0 standard drinks    Types: 2 Cans of beer per week  . Drug use: No    Review of Systems Constitutional: No fever/chills Eyes: No visual changes. ENT: No sore throat. Cardiovascular: Denies chest pain. Respiratory: Denies shortness of breath. Gastrointestinal: No abdominal pain.  No nausea, no vomiting.  No diarrhea.  Mild constipation. Genitourinary: Negative for dysuria. Musculoskeletal: Negative for back pain. Skin: Negative for rash.  Neurological: Negative for headaches, focal weakness or numbness.   ____________________________________________   PHYSICAL EXAM:  VITAL SIGNS: ED Triage Vitals  Enc Vitals Group     BP 04/03/19 1340 137/87     Pulse Rate 04/03/19 1340 75     Resp 04/03/19 1340 17     Temp 04/03/19 1342 98.2  F (36.8 C)     Temp Source 04/03/19 1340 Oral     SpO2 04/03/19 1340 98 %     Weight 04/03/19 1341 275 lb (124.7 kg)     Height 04/03/19 1341 6\' 4"  (1.93 m)     Head Circumference --      Peak Flow --      Pain Score 04/03/19 1341 4     Pain Loc --      Pain Edu? --      Excl. in [place]? --     Constitutional: Alert and oriented. Well appearing and in no acute distress. Cardiovascular: Normal rate, regular rhythm. Grossly normal heart sounds.  Good peripheral  circulation. Respiratory: Normal respiratory effort.  No retractions. Lungs CTAB. Gastrointestinal: Soft and nontender. No distention. No abdominal bruits. No CVA tenderness.  Residual external skin tag at the 12 o'clock position.  Guaiac negative. Musculoskeletal: No lower extremity tenderness nor edema.  No joint effusions. Neurologic:  Normal speech and language. No gross focal neurologic deficits are appreciated. No gait instability. Skin:  Skin is warm, dry and intact. No rash noted. Psychiatric: Mood and affect are normal. Speech and behavior are normal.  ____________________________________________   LABS (all labs ordered are listed, but only abnormal results are displayed)  Labs Reviewed - No data to display ____________________________________________  EKG   ____________________________________________  RADIOLOGY  ED MD interpretation:    Official radiology report(s): No results found.  ____________________________________________   PROCEDURES  Procedure(s) performed (including Critical Care):  Procedures   ____________________________________________   INITIAL IMPRESSION / ASSESSMENT AND PLAN / ED COURSE  As part of my medical decision making, I reviewed the following data within the [place]     Patient presents with hemorrhoids and mild rectal bleeding.  Patient is a bleeding has resolved.  Physical exam consistent with hemorrhoids.  Patient given discharge care instruction advised take medication as directed.  Patient advised follow-up PCP.    OLIVER HEITZENRATER was evaluated in Emergency Department on 04/03/2019 for the symptoms described in the history of present illness. He was evaluated in the context of the global COVID-19 pandemic, which necessitated consideration that the patient might be at risk for infection with the SARS-CoV-2 virus that causes COVID-19. Institutional protocols and algorithms that pertain to the evaluation of  patients at risk for COVID-19 are in a state of rapid change based on information released by regulatory bodies including the CDC and federal and state organizations. These policies and algorithms were followed during the patient's care in the ED.       ____________________________________________   FINAL CLINICAL IMPRESSION(S) / ED DIAGNOSES  Final diagnoses:  Residual hemorrhoidal skin tags     ED Discharge Orders         Ordered    hydrocortisone (ANUSOL-HC) 25 MG suppository  Every 12 hours     04/03/19 1543    docusate sodium (COLACE) 100 MG capsule  2 times daily     04/03/19 1543    Lidocaine 0.5 % GEL  2 times daily     04/03/19 1543           Note:  This document was prepared using Dragon voice recognition software and may include unintentional dictation errors.    Sable Feil, PA-C 04/03/19 1549    Lilia Pro., MD 04/03/19 [phone]

## 2019-04-04 ENCOUNTER — Telehealth: Payer: Self-pay

## 2019-04-04 NOTE — Telephone Encounter (Signed)
-----   Message from Maple Hudson., MD sent at 04/04/2019  3:56 PM EST ----- Labs okay except for mildly elevated cholesterol and mildly elevated liver functions consistent with a fatty liver.  Work on diet and exercise as discussed at time of visit.

## 2019-04-04 NOTE — Telephone Encounter (Signed)
LMTCB, PEC may give results. 

## 2019-04-04 NOTE — Telephone Encounter (Signed)
Attempted to reach pt. at number provided; 3rd party VM, did not leave message to CB.

## 2019-04-05 NOTE — Telephone Encounter (Signed)
Call to patient- notified of lab results and PCP recommendations.

## 2019-04-05 NOTE — Telephone Encounter (Signed)
Male answered at number listed- she said she would get patient number and I could call back- when called back no answer. Left message to have patient call PCP for lab results.

## 2019-04-05 NOTE — Telephone Encounter (Signed)
Pt's mother called back with pt's cell. [phone].

## 2019-04-06 ENCOUNTER — Ambulatory Visit (INDEPENDENT_AMBULATORY_CARE_PROVIDER_SITE_OTHER): Payer: BC Managed Care – PPO | Admitting: Family Medicine

## 2019-04-06 DIAGNOSIS — M79672 Pain in left foot: Secondary | ICD-10-CM | POA: Diagnosis not present

## 2019-04-06 DIAGNOSIS — M722 Plantar fascial fibromatosis: Secondary | ICD-10-CM | POA: Diagnosis not present

## 2019-04-06 NOTE — Progress Notes (Signed)
Patient: Stuart Mclaughlin Male    DOB: Mar 15, 1985   34 y.o.   MRN: [id number] Visit Date: 04/06/2019  Today's Provider: Wilhemena Durie, MD   No chief complaint on file.  Subjective:    Virtual Visit via Telephone Note  I connected with Stuart Mclaughlin on 04/06/19 at  2:00 PM EST by telephone and verified that I am speaking with the correct person using two identifiers.  Location: Patient: home Provider: office   I discussed the limitations, risks, security and privacy concerns of performing an evaluation and management service by telephone and the availability of in person appointments. I also discussed with the patient that there may be a patient responsible charge related to this service. The patient expressed understanding and agreed to proceed.   HPI 34 year old with no known trauma injury or any signs of infection dates that he has had left foot and ankle pain for the past couple of days.  He states it hurts on the bottom of his foot in the heel region when he gets up.  He states it hurts enough to cause him to be unable to bear weight.  Again no trauma and no break in the skin.  There is an x-ray from 2019 that shows mild arthritis.  He has not done anything to try to help this pain. No Known Allergies   Current Outpatient Medications:  .  amLODipine (NORVASC) 5 MG tablet, Take 1 tablet (5 mg total) by mouth daily., Disp: 30 tablet, Rfl: 3 .  docusate sodium (COLACE) 100 MG capsule, Take 1 capsule (100 mg total) by mouth 2 (two) times daily., Disp: 20 capsule, Rfl: 0 .  hydrocortisone (ANUSOL-HC) 25 MG suppository, Place 1 suppository (25 mg total) rectally every 12 (twelve) hours., Disp: 12 suppository, Rfl: 1 .  ibuprofen (ADVIL) 600 MG tablet, Take 1 tablet (600 mg total) by mouth every 8 (eight) hours as needed., Disp: 15 tablet, Rfl: 0 .  Lidocaine 0.5 % GEL, Apply 1 application topically in the morning and at bedtime., Disp: 170 g, Rfl: 0  Review of  Systems  Constitutional: Negative for appetite change, chills and fever.  Respiratory: Negative for chest tightness, shortness of breath and wheezing.   Cardiovascular: Negative for chest pain and palpitations.  Gastrointestinal: Negative for abdominal pain, nausea and vomiting.    Social History   Tobacco Use  . Smoking status: Smoker, Current Status Unknown    Types: Cigarettes  . Smokeless tobacco: Never Used  . Tobacco comment: 2-3 ciggs per day per pt   Substance Use Topics  . Alcohol use: Yes    Alcohol/week: 2.0 standard drinks    Types: 2 Cans of beer per week      Objective:   There were no vitals taken for this visit. There were no vitals filed for this visit.There is no height or weight on file to calculate BMI.   Physical Exam   No results found for any visits on 04/06/19.     Assessment & Plan     1. Foot pain, left He is not not describing gout at all.  There is no swelling and no warmth.  The pain is in the heel region on the bottom of the foot.  This fits most with plantar fasciitis so although it could be an arthritic problem.  He is not diabetic according to recent lab work stenotic Charcot foot.  At this time will treat with Aleve or Advil 2  with breakfast and 2 with supper.  We will see him back if it does not improve for imaging and evaluation.  He agrees with the plan.  2. Plantar fasciitis of left foot   I discussed the assessment and treatment plan with the patient. The patient was provided an opportunity to ask questions and all were answered. The patient agreed with the plan and demonstrated an understanding of the instructions.   The patient was advised to call back or seek an in-person evaluation if the symptoms worsen or if the condition fails to improve as anticipated.  I provided 10 minutes of non-face-to-face time during this encounter.    Richard Wendelyn Breslow, MD  [facility]

## 2019-05-19 NOTE — Progress Notes (Deleted)
       Patient: Stuart Mclaughlin Male    DOB: 04/16/1985   34 y.o.   MRN: [id number] Visit Date: 05/19/2019  Today's Provider: Megan Mans, MD   No chief complaint on file.  Subjective:     HPI   Hypertension, unspecified type From 03/29/2019-Started amLODipine (NORVASC) 5 MG tablet daily.  OSA (obstructive sleep apnea) From 03/29/2019-Epworth of 16. Ordered home sleep test.  No Known Allergies   Current Outpatient Medications:  .  amLODipine (NORVASC) 5 MG tablet, Take 1 tablet (5 mg total) by mouth daily., Disp: 30 tablet, Rfl: 3 .  docusate sodium (COLACE) 100 MG capsule, Take 1 capsule (100 mg total) by mouth 2 (two) times daily., Disp: 20 capsule, Rfl: 0 .  hydrocortisone (ANUSOL-HC) 25 MG suppository, Place 1 suppository (25 mg total) rectally every 12 (twelve) hours., Disp: 12 suppository, Rfl: 1 .  ibuprofen (ADVIL) 600 MG tablet, Take 1 tablet (600 mg total) by mouth every 8 (eight) hours as needed., Disp: 15 tablet, Rfl: 0 .  Lidocaine 0.5 % GEL, Apply 1 application topically in the morning and at bedtime., Disp: 170 g, Rfl: 0  Review of Systems  Constitutional: Negative for appetite change, chills and fever.  Respiratory: Negative for chest tightness, shortness of breath and wheezing.   Cardiovascular: Negative for chest pain and palpitations.  Gastrointestinal: Negative for abdominal pain, nausea and vomiting.    Social History   Tobacco Use  . Smoking status: Smoker, Current Status Unknown    Types: Cigarettes  . Smokeless tobacco: Never Used  . Tobacco comment: 2-3 ciggs per day per pt   Substance Use Topics  . Alcohol use: Yes    Alcohol/week: 2.0 standard drinks    Types: 2 Cans of beer per week      Objective:   There were no vitals taken for this visit. There were no vitals filed for this visit.There is no height or weight on file to calculate BMI.   Physical Exam   No results found for any visits on 05/29/19.     Assessment &  Plan        Megan Mans, MD  [facility]

## 2019-05-29 ENCOUNTER — Ambulatory Visit: Payer: Self-pay | Admitting: Family Medicine

## 2019-06-01 ENCOUNTER — Other Ambulatory Visit: Payer: Self-pay

## 2019-06-01 ENCOUNTER — Emergency Department
Admission: EM | Admit: 2019-06-01 | Discharge: 2019-06-01 | Disposition: A | Discharge status: Home / Self Care | Payer: BC Managed Care – PPO | Attending: Emergency Medicine | Admitting: Emergency Medicine

## 2019-06-01 ENCOUNTER — Emergency Department: Payer: BC Managed Care – PPO

## 2019-06-01 DIAGNOSIS — M7989 Other specified soft tissue disorders: Secondary | ICD-10-CM | POA: Diagnosis not present

## 2019-06-01 DIAGNOSIS — R2231 Localized swelling, mass and lump, right upper limb: Secondary | ICD-10-CM | POA: Diagnosis not present

## 2019-06-01 DIAGNOSIS — M25521 Pain in right elbow: Secondary | ICD-10-CM

## 2019-06-01 DIAGNOSIS — Z79899 Other long term (current) drug therapy: Secondary | ICD-10-CM | POA: Diagnosis not present

## 2019-06-01 DIAGNOSIS — M79601 Pain in right arm: Secondary | ICD-10-CM | POA: Diagnosis not present

## 2019-06-01 DIAGNOSIS — F17210 Nicotine dependence, cigarettes, uncomplicated: Secondary | ICD-10-CM | POA: Insufficient documentation

## 2019-06-01 MED ORDER — NAPROXEN 500 MG PO TABS: 500.0000 mg | ORAL_TABLET | Freq: Two times a day (BID) | ORAL | 0 refills | Status: DC

## 2019-06-01 MED ORDER — IBUPROFEN 800 MG PO TABS
800.0000 mg | ORAL_TABLET | Freq: Once | ORAL | Status: AC
  Administered 2019-06-01: 800 mg via ORAL
  Filled 2019-06-01: qty 1

## 2019-06-01 MED ORDER — KETOROLAC TROMETHAMINE 30 MG/ML IJ SOLN
30.0000 mg | Freq: Once | INTRAMUSCULAR | Status: AC
  Administered 2019-06-01: 30 mg via INTRAMUSCULAR
  Filled 2019-06-01: qty 1

## 2019-06-01 MED ORDER — HYDROCODONE-ACETAMINOPHEN 5-325 MG PO TABS: 1.0000 | ORAL_TABLET | Freq: Three times a day (TID) | ORAL | 0 refills | Status: DC | PRN

## 2019-06-01 NOTE — Discharge Instructions (Addendum)
There is no fracture on your x-ray.  Your x-ray does show some swelling in the large bone spur in your elbow.  I suspect that you have some tendinitis and some inflammation.

## 2019-06-01 NOTE — ED Notes (Signed)
See triage note  Having pain to right elbow  States he felt a pop to area a few days ago

## 2019-06-01 NOTE — ED Triage Notes (Signed)
Pt states his right elbow popped last week and has been swelling and pain since,

## 2019-06-01 NOTE — ED Provider Notes (Signed)
[facility] Emergency Department Provider Note  ____________________________________________  Time seen: Approximately 11:37 AM  I have reviewed the triage vital signs and the nursing notes.   HISTORY  Chief Complaint Elbow Pain    HPI Stuart Mclaughlin is a 34 y.o. male that presents to the emergency department for evaluation of right forearm pain for several days.  Patient heard a pop to his right elbow early this week.  He immediately started with pain and swelling to his elbow.  He now feels that his forearm is painful and swelling.  Area feels "tight." He has pain with moving his right hand.  No specific trauma.  He has had gout before.  No IV drug use.  No fever, chills.  Past Medical History:  Diagnosis Date  . Allergy   . Arthritis   . Deep vein thrombosis (DVT) (HCC)     There are no problems to display for this patient.   Past Surgical History:  Procedure Laterality Date  . PERIPHERAL VASCULAR CATHETERIZATION  04/01/2015   Procedure: Lower Extremity Venography;  Surgeon: Annice Needy, MD;  Location: [facility] INVASIVE CV LAB;  Service: Cardiovascular;;  . PERIPHERAL VASCULAR CATHETERIZATION Right 04/01/2015   Procedure: Lower Extremity Intervention;  Surgeon: Annice Needy, MD;  Location: [facility] INVASIVE CV LAB;  Service: Cardiovascular;  Laterality: Right;  . PERIPHERAL VASCULAR CATHETERIZATION N/A 04/01/2015   Procedure: IVC Filter Insertion;  Surgeon: Annice Needy, MD;  Location: [facility] INVASIVE CV LAB;  Service: Cardiovascular;  Laterality: N/A;    Prior to Admission medications   Medication Sig Start Date End Date Taking? Authorizing Provider  amLODipine (NORVASC) 5 MG tablet Take 1 tablet (5 mg total) by mouth daily. 03/29/19   Maple Hudson., MD  docusate sodium (COLACE) 100 MG capsule Take 1 capsule (100 mg total) by mouth 2 (two) times daily. 04/03/19 04/02/20  Joni Reining, PA-C  HYDROcodone-acetaminophen (NORCO) 5-325 MG tablet Take 1  tablet by mouth every 8 (eight) hours as needed for moderate pain. 06/01/19   Enid Derry, PA-C  hydrocortisone (ANUSOL-HC) 25 MG suppository Place 1 suppository (25 mg total) rectally every 12 (twelve) hours. 04/03/19 04/02/20  Joni Reining, PA-C  ibuprofen (ADVIL) 600 MG tablet Take 1 tablet (600 mg total) by mouth every 8 (eight) hours as needed. 11/24/18   Joni Reining, PA-C  Lidocaine 0.5 % GEL Apply 1 application topically in the morning and at bedtime. 04/03/19   Joni Reining, PA-C  naproxen (NAPROSYN) 500 MG tablet Take 1 tablet (500 mg total) by mouth 2 (two) times daily with a meal. 06/01/19 05/31/20  Enid Derry, PA-C    Allergies Patient has no known allergies.  No family history on file.  Social History Social History   Tobacco Use  . Smoking status: Smoker, Current Status Unknown    Types: Cigarettes  . Smokeless tobacco: Never Used  . Tobacco comment: 2-3 ciggs per day per pt   Substance Use Topics  . Alcohol use: Yes    Alcohol/week: 2.0 standard drinks    Types: 2 Cans of beer per week  . Drug use: No     Review of Systems  Cardiovascular: No chest pain. Respiratory: No SOB. Gastrointestinal: No abdominal pain.  No nausea, no vomiting.  Musculoskeletal: Positive for elbow pain. Skin: Negative for rash, abrasions, lacerations, ecchymosis. Neurological: Negative for numbness or tingling   ____________________________________________   PHYSICAL EXAM:  VITAL SIGNS: ED Triage Vitals  Enc Vitals  Group     BP 06/01/19 0958 (!) 143/98     Pulse Rate 06/01/19 0958 68     Resp 06/01/19 0958 18     Temp 06/01/19 0958 98.3 F (36.8 C)     Temp Source 06/01/19 0958 Oral     SpO2 06/01/19 0958 99 %     Weight 06/01/19 0951 270 lb (122.5 kg)     Height 06/01/19 0951 6\' 4"  (1.93 m)     Head Circumference --      Peak Flow --      Pain Score 06/01/19 0951 8     Pain Loc --      Pain Edu? --      Excl. in GC? --      Constitutional: Alert and  oriented. Well appearing and in no acute distress. Eyes: Conjunctivae are normal. PERRL. EOMI. Head: Atraumatic. ENT:      Ears:      Nose: No congestion/rhinnorhea.      Mouth/Throat: Mucous membranes are moist.  Neck: No stridor. Cardiovascular: Normal rate, regular rhythm.  Good peripheral circulation. Respiratory: Normal respiratory effort without tachypnea or retractions. Lungs CTAB. Good air entry to the bases with no decreased or absent breath sounds. Musculoskeletal: Full range of motion to all extremities. No gross deformities appreciated. Mild pain with ROM of right elbow. Mild swelling to right elbow. No tenderness to palpation over right forearm. Pain elicited with right grip. No erythema.  Neurologic:  Normal speech and language. No gross focal neurologic deficits are appreciated.  Skin:  Skin is warm, dry and intact. No rash noted. Psychiatric: Mood and affect are normal. Speech and behavior are normal. Patient exhibits appropriate insight and judgement.   ____________________________________________   LABS (all labs ordered are listed, but only abnormal results are displayed)  Labs Reviewed - No data to display ____________________________________________  EKG   ____________________________________________  RADIOLOGY 06/03/19, personally viewed and evaluated these images (plain radiographs) as part of my medical decision making, as well as reviewing the written report by the radiologist.  DG Elbow Complete Right  Result Date: 06/01/2019 CLINICAL DATA:  RIGHT elbow pain for 1 week, felt his elbow pop when pushing himself out of a chair, swelling, difficulty with full extension EXAM: RIGHT ELBOW - COMPLETE 3+ VIEW COMPARISON:  09/16/2017 FINDINGS: Osseous mineralization normal. Joint spaces preserved. Bulky olecranon spur unchanged. No fracture, dislocation, or bone destruction. No joint effusion. Dorsal soft tissue swelling overlying the olecranon. IMPRESSION:  Large LEFT for non spur, unchanged. Dorsal soft tissue swelling without acute osseous abnormalities. Electronically Signed   By: 11/16/2017 M.D.   On: 06/01/2019 11:02   06/03/2019 Venous Img Upper Uni Right(DVT)  Result Date: 06/01/2019 CLINICAL DATA:  Right arm swelling EXAM: RIGHT UPPER EXTREMITY VENOUS DOPPLER ULTRASOUND TECHNIQUE: Gray-scale sonography with graded compression, as well as color Doppler and duplex ultrasound were performed to evaluate the upper extremity deep venous system from the level of the subclavian vein and including the jugular, axillary, basilic, radial, ulnar and upper cephalic vein. Spectral Doppler was utilized to evaluate flow at rest and with distal augmentation maneuvers. COMPARISON:  None. FINDINGS: Contralateral Subclavian Vein: Respiratory phasicity is normal and symmetric with the symptomatic side. No evidence of thrombus. Normal compressibility. Internal Jugular Vein: No evidence of thrombus. Normal compressibility, respiratory phasicity and response to augmentation. Subclavian Vein: No evidence of thrombus. Normal compressibility, respiratory phasicity and response to augmentation. Axillary Vein: No evidence of thrombus. Normal compressibility, respiratory phasicity  and response to augmentation. Cephalic Vein: No evidence of thrombus. Normal compressibility, respiratory phasicity and response to augmentation. Basilic Vein: No evidence of thrombus. Normal compressibility, respiratory phasicity and response to augmentation. Brachial Veins: No evidence of thrombus. Normal compressibility, respiratory phasicity and response to augmentation. Radial Veins: No evidence of thrombus. Normal compressibility, respiratory phasicity and response to augmentation. Ulnar Veins: No evidence of thrombus. Normal compressibility, respiratory phasicity and response to augmentation. Venous Reflux:  None visualized. Other Findings:  None visualized. IMPRESSION: No evidence of DVT within the right  upper extremity. Electronically Signed   By: Davina Poke D.O.   On: 06/01/2019 13:29    ____________________________________________    PROCEDURES  Procedure(s) performed:    Procedures    Medications  ibuprofen (ADVIL) tablet 800 mg (800 mg Oral Given 06/01/19 1122)  ketorolac (TORADOL) 30 MG/ML injection 30 mg (30 mg Intramuscular Given 06/01/19 1345)     ____________________________________________   INITIAL IMPRESSION / ASSESSMENT AND PLAN / ED COURSE  Pertinent labs & imaging results that were available during my care of the patient were reviewed by me and considered in my medical decision making (see chart for details).  Review of the [place] CSRS was performed in accordance of the [place] prior to dispensing any controlled drugs.   Patient presented to emergency department for evaluation of right elbow pain.  Vital signs and exam are reassuring.  X-ray negative for acute bony abnormalities.  Symptoms are most consistent with an inflammatory cause.  No indication of bacterial infection.  Patient was given Toradol for pain.  Patient will be discharged home with prescriptions for naproxen and a short course of Norco. Patient is to follow up with primary care and orthopedics as directed. Patient is given ED precautions to return to the ED for any worsening or new symptoms.  Stuart Mclaughlin was evaluated in Emergency Department on 06/01/2019 for the symptoms described in the history of present illness. He was evaluated in the context of the global COVID-19 pandemic, which necessitated consideration that the patient might be at risk for infection with the SARS-CoV-2 virus that causes COVID-19. Institutional protocols and algorithms that pertain to the evaluation of patients at risk for COVID-19 are in a state of rapid change based on information released by regulatory bodies including the CDC and federal and state organizations. These policies and algorithms were followed during the  patient's care in the ED.   ____________________________________________  FINAL CLINICAL IMPRESSION(S) / ED DIAGNOSES  Final diagnoses:  Arm swelling  Right elbow pain      NEW MEDICATIONS STARTED DURING THIS VISIT:  ED Discharge Orders         Ordered    HYDROcodone-acetaminophen (NORCO) 5-325 MG tablet  Every 8 hours PRN     06/01/19 1338    naproxen (NAPROSYN) 500 MG tablet  2 times daily with meals     06/01/19 1338              This chart was dictated using voice recognition software/Dragon. Despite best efforts to proofread, errors can occur which can change the meaning. Any change was purely unintentional.    Laban Emperor, PA-C 06/01/19 1553    Blake Divine, MD 06/02/19 [phone]

## 2019-06-01 NOTE — ED Notes (Signed)
Pt ambulating to lobby, did not want wheelchair, denies dizziness, gait steady

## 2019-06-26 ENCOUNTER — Other Ambulatory Visit: Payer: Self-pay | Admitting: Family Medicine

## 2019-06-26 DIAGNOSIS — I10 Essential (primary) hypertension: Secondary | ICD-10-CM

## 2019-06-30 ENCOUNTER — Ambulatory Visit: Payer: BLUE CROSS/BLUE SHIELD | Admitting: Nurse Practitioner

## 2019-06-30 ENCOUNTER — Other Ambulatory Visit: Payer: Self-pay

## 2019-06-30 ENCOUNTER — Encounter: Payer: Self-pay | Admitting: Nurse Practitioner

## 2019-06-30 VITALS — BP 146/100 | HR 69 | Temp 97.9°F | Resp 18 | Ht 76.0 in | Wt 287.0 lb

## 2019-06-30 DIAGNOSIS — Z7251 High risk heterosexual behavior: Secondary | ICD-10-CM

## 2019-06-30 DIAGNOSIS — Z202 Contact with and (suspected) exposure to infections with a predominantly sexual mode of transmission: Secondary | ICD-10-CM

## 2019-06-30 DIAGNOSIS — Z91199 Patient's noncompliance with other medical treatment and regimen due to unspecified reason: Secondary | ICD-10-CM

## 2019-06-30 DIAGNOSIS — I10 Essential (primary) hypertension: Secondary | ICD-10-CM

## 2019-06-30 NOTE — Patient Instructions (Signed)
Nice meeting you today Stuart Mclaughlin! I will call you when your labs return.  Please take your blood pressure medication as directed by your primary care provider Remember there is a risk for stroke and kidney damage with uncontrolled blood pressure Remember to use condoms with all partners No sex until your labs return, then we will discuss further steps Please return to the clinic as needed

## 2019-06-30 NOTE — Progress Notes (Signed)
   Subjective:    Patient ID: Stuart Mclaughlin, male    DOB: 09-Mar-1985, 34 y.o.   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 He reports his male partner was diagnosed with chlamydia 2 days ago and has undergone treatment. He reports he was with this partner 4 days ago and a 1 week ago before that. He admits he has 3 male partners and uses condoms with 2/3 of them and has been with this partner x 2 months which he feels was the partner who contracted the disease. He reports a hx of chlamydia 12/2018 and no other STI history in which he received treatment for. He denies any penile discharge, burning, odor or lesions.   Blood pressure elevated today and he reports he doesn't take his Amlodipine as directed and takes it about twice a week; which he did take it last night.     Review of Systems  Constitutional: Negative for fever.  Respiratory: Negative for shortness of breath and wheezing.   Genitourinary: Negative for discharge, dysuria, genital sores and hematuria.       Objective:   Physical Exam Constitutional:      Appearance: Normal appearance.  Cardiovascular:     Rate and Rhythm: Normal rate and regular rhythm.  Pulmonary:     Effort: Pulmonary effort is normal.     Breath sounds: Normal breath sounds.  Abdominal:     General: Bowel sounds are normal.  Neurological:     Mental Status: He is alert and oriented to person, place, and time.  Psychiatric:        Mood and Affect: Mood normal.     Comments: wringing of the hands noted           Assessment & Plan:  High risk sexual behavior: discussed risk of unprotected sex and the need to use condoms. HTN: discussed importance of medication adherence and risks of not taking medication as instructed.

## 2019-07-01 LAB — RPR: RPR Ser Ql: NONREACTIVE

## 2019-07-01 LAB — HEPATITIS PANEL, ACUTE
Hep A IgM: NEGATIVE
Hep B C IgM: NEGATIVE
Hep C Virus Ab: <0.1 s/co ratio (ref 0.0–0.9)
Hepatitis B Surface Ag: NEGATIVE

## 2019-07-01 LAB — HIV ANTIBODY (ROUTINE TESTING W REFLEX): HIV Screen 4th Generation wRfx: NONREACTIVE

## 2019-07-03 ENCOUNTER — Other Ambulatory Visit: Payer: Self-pay | Admitting: Nurse Practitioner

## 2019-07-03 ENCOUNTER — Telehealth: Payer: Self-pay | Admitting: Nurse Practitioner

## 2019-07-03 ENCOUNTER — Ambulatory Visit: Payer: BLUE CROSS/BLUE SHIELD | Admitting: Nurse Practitioner

## 2019-07-03 DIAGNOSIS — Z202 Contact with and (suspected) exposure to infections with a predominantly sexual mode of transmission: Secondary | ICD-10-CM

## 2019-07-03 LAB — CHLAMYDIA/GONOCOCCUS/TRICHOMONAS, NAA
Chlamydia by NAA: NEGATIVE
Gonococcus by NAA: NEGATIVE
Trich vag by NAA: NEGATIVE

## 2019-07-03 NOTE — Telephone Encounter (Signed)
Tried to call patient to discuss labs and line busy. Will attempt at a later time.

## 2019-07-13 ENCOUNTER — Other Ambulatory Visit: Payer: BLUE CROSS/BLUE SHIELD

## 2019-09-11 ENCOUNTER — Ambulatory Visit: Payer: BLUE CROSS/BLUE SHIELD | Admitting: Medical

## 2019-09-11 ENCOUNTER — Other Ambulatory Visit: Payer: Self-pay

## 2019-09-11 ENCOUNTER — Emergency Department
Admission: EM | Admit: 2019-09-11 | Discharge: 2019-09-11 | Disposition: A | Discharge status: Home / Self Care | Payer: BC Managed Care – PPO | Attending: Emergency Medicine | Admitting: Emergency Medicine

## 2019-09-11 ENCOUNTER — Emergency Department: Payer: BC Managed Care – PPO

## 2019-09-11 ENCOUNTER — Encounter: Payer: Self-pay | Admitting: Emergency Medicine

## 2019-09-11 DIAGNOSIS — M7022 Olecranon bursitis, left elbow: Secondary | ICD-10-CM

## 2019-09-11 DIAGNOSIS — R079 Chest pain, unspecified: Secondary | ICD-10-CM | POA: Diagnosis not present

## 2019-09-11 DIAGNOSIS — J984 Other disorders of lung: Secondary | ICD-10-CM | POA: Diagnosis not present

## 2019-09-11 DIAGNOSIS — R918 Other nonspecific abnormal finding of lung field: Secondary | ICD-10-CM | POA: Diagnosis not present

## 2019-09-11 DIAGNOSIS — Y939 Activity, unspecified: Secondary | ICD-10-CM | POA: Insufficient documentation

## 2019-09-11 DIAGNOSIS — I82409 Acute embolism and thrombosis of unspecified deep veins of unspecified lower extremity: Secondary | ICD-10-CM | POA: Diagnosis not present

## 2019-09-11 DIAGNOSIS — Z87891 Personal history of nicotine dependence: Secondary | ICD-10-CM | POA: Diagnosis not present

## 2019-09-11 DIAGNOSIS — J9809 Other diseases of bronchus, not elsewhere classified: Secondary | ICD-10-CM | POA: Diagnosis not present

## 2019-09-11 DIAGNOSIS — R609 Edema, unspecified: Secondary | ICD-10-CM | POA: Diagnosis not present

## 2019-09-11 DIAGNOSIS — I10 Essential (primary) hypertension: Secondary | ICD-10-CM | POA: Diagnosis not present

## 2019-09-11 DIAGNOSIS — R0789 Other chest pain: Secondary | ICD-10-CM

## 2019-09-11 DIAGNOSIS — Z79899 Other long term (current) drug therapy: Secondary | ICD-10-CM | POA: Insufficient documentation

## 2019-09-11 HISTORY — DX: Essential (primary) hypertension: I10

## 2019-09-11 LAB — CBC
HCT: 43.6 % (ref 39.0–52.0)
Hemoglobin: 15.7 g/dL (ref 13.0–17.0)
MCH: 32.6 pg (ref 26.0–34.0)
MCHC: 36 g/dL (ref 30.0–36.0)
MCV: 90.6 fL (ref 80.0–100.0)
Platelets: 166 10*3/uL (ref 150–400)
RBC: 4.81 MIL/uL (ref 4.22–5.81)
RDW: 13.4 % (ref 11.5–15.5)
WBC: 7.2 10*3/uL (ref 4.0–10.5)
nRBC: 0 % (ref 0.0–0.2)

## 2019-09-11 LAB — TROPONIN I (HIGH SENSITIVITY)
Troponin I (High Sensitivity): 3 ng/L (ref <18)
Troponin I (High Sensitivity): 5 ng/L (ref <18)

## 2019-09-11 LAB — BASIC METABOLIC PANEL
Anion gap: 10 (ref 5–15)
BUN: 11 mg/dL (ref 6–20)
CO2: 22 mmol/L (ref 22–32)
Calcium: 8.7 mg/dL — ABNORMAL LOW (ref 8.9–10.3)
Chloride: 105 mmol/L (ref 98–111)
Creatinine, Ser: 1.16 mg/dL (ref 0.61–1.24)
GFR calc Af Amer: >60 mL/min (ref >60)
GFR calc non Af Amer: >60 mL/min (ref >60)
Glucose, Bld: 120 mg/dL — ABNORMAL HIGH (ref 70–99)
Potassium: 4 mmol/L (ref 3.5–5.1)
Sodium: 137 mmol/L (ref 135–145)

## 2019-09-11 MED ORDER — IOHEXOL 350 MG/ML SOLN
75.0000 mL | Freq: Once | INTRAVENOUS | Status: AC | PRN
  Administered 2019-09-11: 75 mL via INTRAVENOUS
  Filled 2019-09-11: qty 75

## 2019-09-11 MED ORDER — NAPROXEN 500 MG PO TABS: 500.0000 mg | ORAL_TABLET | Freq: Two times a day (BID) | ORAL | 0 refills | Status: DC

## 2019-09-11 MED ORDER — KETOROLAC TROMETHAMINE 30 MG/ML IJ SOLN
15.0000 mg | INTRAMUSCULAR | Status: AC
  Administered 2019-09-11: 15 mg via INTRAVENOUS
  Filled 2019-09-11: qty 1

## 2019-09-11 MED ORDER — SODIUM CHLORIDE 0.9 % IV BOLUS
1000.0000 mL | Freq: Once | INTRAVENOUS | Status: AC
  Administered 2019-09-11: 1000 mL via INTRAVENOUS

## 2019-09-11 NOTE — ED Provider Notes (Signed)
[facility] Emergency Department Provider Note  ____________________________________________  Time seen: Approximately 3:10 PM  I have reviewed the triage vital signs and the nursing notes.   HISTORY  Chief Complaint Chest Pain and Arm Swelling    HPI Stuart Mclaughlin is a 34 y.o. male with a past history of hypertension and DVT who comes the ED complaining of a sudden onset of left-sided chest pain radiating to left arm, associated with diaphoresis.  Initially 10/10 at onset.  Now currently 8/10.  It is constant, no aggravating or alleviating factors.  Arm hurts in the upper arm down to the elbow.  Denies shortness of breath, pain is not exertional nor pleuritic.  Pain was onset while patient was lifting something into the back of a truck.  Not currently on blood thinners.  No recent trauma hospitalization or surgery.  Does complain of swelling of the left elbow.  No pain in the elbow joint but just on the extensor surface.  Notes that he has been putting pressure on that area lately at work.      Past Medical History:  Diagnosis Date  . Allergy   . Arthritis   . Deep vein thrombosis (DVT) (HCC)   . Hypertension      There are no problems to display for this patient.    Past Surgical History:  Procedure Laterality Date  . PERIPHERAL VASCULAR CATHETERIZATION  04/01/2015   Procedure: Lower Extremity Venography;  Surgeon: Annice Needy, MD;  Location: [facility] INVASIVE CV LAB;  Service: Cardiovascular;;  . PERIPHERAL VASCULAR CATHETERIZATION Right 04/01/2015   Procedure: Lower Extremity Intervention;  Surgeon: Annice Needy, MD;  Location: [facility] INVASIVE CV LAB;  Service: Cardiovascular;  Laterality: Right;  . PERIPHERAL VASCULAR CATHETERIZATION N/A 04/01/2015   Procedure: IVC Filter Insertion;  Surgeon: Annice Needy, MD;  Location: [facility] INVASIVE CV LAB;  Service: Cardiovascular;  Laterality: N/A;     Prior to Admission medications   Medication Sig Start  Date End Date Taking? Authorizing Provider  amLODipine (NORVASC) 5 MG tablet TAKE 1 TABLET BY MOUTH EVERY DAY 06/26/19   Maple Hudson., MD  docusate sodium (COLACE) 100 MG capsule Take 1 capsule (100 mg total) by mouth 2 (two) times daily. Patient not taking: Reported on 06/30/2019 04/03/19 04/02/20  Joni Reining, PA-C  HYDROcodone-acetaminophen [facility]) 5-325 MG tablet Take 1 tablet by mouth every 8 (eight) hours as needed for moderate pain. Patient not taking: Reported on 06/30/2019 06/01/19   Enid Derry, PA-C  hydrocortisone (ANUSOL-HC) 25 MG suppository Place 1 suppository (25 mg total) rectally every 12 (twelve) hours. Patient not taking: Reported on 06/30/2019 04/03/19 04/02/20  Joni Reining, PA-C  ibuprofen (ADVIL) 600 MG tablet Take 1 tablet (600 mg total) by mouth every 8 (eight) hours as needed. 11/24/18   Joni Reining, PA-C  Lidocaine 0.5 % GEL Apply 1 application topically in the morning and at bedtime. Patient not taking: Reported on 06/30/2019 04/03/19   Joni Reining, PA-C  naproxen (NAPROSYN) 500 MG tablet Take 1 tablet (500 mg total) by mouth 2 (two) times daily with a meal. 09/11/19   Sharman Cheek, MD     Allergies Patient has no known allergies.   No family history on file.  Social History Social History   Tobacco Use  . Smoking status: Former Smoker    Types: Cigarettes  . Smokeless tobacco: Never Used  Vaping Use  . Vaping Use: Never used  Substance Use Topics  .  Alcohol use: Yes    Alcohol/week: 2.0 standard drinks    Types: 2 Cans of beer per week  . Drug use: No    Review of Systems  Constitutional:   No fever or chills.  ENT:   No sore throat. No rhinorrhea. Cardiovascular: Positive chest pain as above without syncope. Respiratory:   No dyspnea or cough. Gastrointestinal:   Negative for abdominal pain, vomiting and diarrhea.  Musculoskeletal:   Left elbow pain and swelling All other systems reviewed and are negative except as  documented above in ROS and HPI.  ____________________________________________   PHYSICAL EXAM:  VITAL SIGNS: ED Triage Vitals  Enc Vitals Group     BP 09/11/19 1255 (!) 160/109     Pulse Rate 09/11/19 1255 77     Resp 09/11/19 1255 17     Temp 09/11/19 1255 98.8 F (37.1 C)     Temp Source 09/11/19 1255 Oral     SpO2 09/11/19 1255 98 %     Weight 09/11/19 1256 (!) 280 lb (127 kg)     Height 09/11/19 1256 6\' 4"  (1.93 m)     Head Circumference --      Peak Flow --      Pain Score 09/11/19 1256 7     Pain Loc --      Pain Edu? --      Excl. in GC? --     Vital signs reviewed, nursing assessments reviewed.   Constitutional:   Alert and oriented. Non-toxic appearance. Eyes:   Conjunctivae are normal. EOMI. PERRL. ENT      Head:   Normocephalic and atraumatic.      Nose:   Wearing a mask.      Mouth/Throat:   Wearing a mask.      Neck:   No meningismus. Full ROM. Hematological/Lymphatic/Immunilogical:   No cervical lymphadenopathy. Cardiovascular:   RRR. Symmetric bilateral radial and DP pulses.  No murmurs. Cap refill less than 2 seconds. Respiratory:   Normal respiratory effort without tachypnea/retractions. Breath sounds are clear and equal bilaterally. No wheezes/rales/rhonchi. Gastrointestinal:   Soft and nontender. Non distended. There is no CVA tenderness.  No rebound, rigidity, or guarding.  Musculoskeletal:   Normal range of motion in all extremities. No joint effusions.  No lower extremity tenderness.  No edema.  Symmetric calf circumference.  No calf tenderness.  Negative Homans' sign. There is bursitis of the extensor surface of the left elbow.  No inflammatory changes.  No significant pain with passive range of motion of the left elbow. Neurologic:   Normal speech and language.  Motor grossly intact. No acute focal neurologic deficits are appreciated.  Skin:    Skin is warm, dry and intact. No rash noted.  No petechiae, purpura, or  bullae.  ____________________________________________    LABS (pertinent positives/negatives) (all labs ordered are listed, but only abnormal results are displayed) Labs Reviewed  BASIC METABOLIC PANEL - Abnormal; Notable for the following components:      Result Value   Glucose, Bld 120 (*)    Calcium 8.7 (*)    All other components within normal limits  CBC  TROPONIN I (HIGH SENSITIVITY)  TROPONIN I (HIGH SENSITIVITY)   ____________________________________________   EKG  Interpreted by me Normal sinus rhythm rate of 76, normal axis and intervals.  Poor R wave progression.  Normal ST segments and T waves.  In lead II there is slight PR depression, no ST elevation from the isoelectric line.  Doubtful clinical  significance given the limited finding in only 1-lead.  ____________________________________________    RADIOLOGY  DG Chest 2 View  Result Date: 09/11/2019 CLINICAL DATA:  Chest pain EXAM: CHEST - 2 VIEW COMPARISON:  06/07/2019 FINDINGS: The heart size and mediastinal contours are within normal limits. Both lungs are clear. The visualized skeletal structures are unremarkable. IMPRESSION: No acute abnormality of the lungs. Electronically Signed   By: Lauralyn Primes M.D.   On: 09/11/2019 13:50   CT Angio Chest PE W and/or Wo Contrast  Result Date: 09/11/2019 CLINICAL DATA:  PE suspected, high prob Sudden onset of chest pain. EXAM: CT ANGIOGRAPHY CHEST WITH CONTRAST TECHNIQUE: Multidetector CT imaging of the chest was performed using the standard protocol during bolus administration of intravenous contrast. Multiplanar CT image reconstructions and MIPs were obtained to evaluate the vascular anatomy. CONTRAST:  88mL OMNIPAQUE IOHEXOL 350 MG/ML SOLN COMPARISON:  Chest radiograph earlier today. FINDINGS: Cardiovascular: There are no filling defects within the pulmonary arteries to suggest pulmonary embolus. Thoracic aorta is normal in caliber without dissection. Heart is normal in  size. No pericardial effusion. Mediastinum/Nodes: No enlarged mediastinal or hilar lymph nodes. No visualized thyroid nodule. No esophageal wall thickening. Lungs/Pleura: Mild central bronchial thickening. No acute or focal airspace disease. No findings of pulmonary edema. No pleural fluid. Minor hypoventilatory changes in the dependent lungs. Upper Abdomen: Hepatic steatosis.  No acute findings. Musculoskeletal: There are no acute or suspicious osseous abnormalities. Review of the MIP images confirms the above findings. IMPRESSION: 1. No pulmonary embolus. 2. Mild central bronchial thickening, can be seen with bronchitis or reactive airways disease. 3. Hepatic steatosis. Electronically Signed   By: Narda Rutherford M.D.   On: 09/11/2019 16:22    ____________________________________________   PROCEDURES Procedures  ____________________________________________  DIFFERENTIAL DIAGNOSIS   Pulmonary embolism, aortic dissection, pneumothorax, left elbow bursitis, non-STEMI  CLINICAL IMPRESSION / ASSESSMENT AND PLAN / ED COURSE  Medications ordered in the ED: Medications  sodium chloride 0.9 % bolus 1,000 mL (0 mLs Intravenous Stopped 09/11/19 1713)  ketorolac (TORADOL) 30 MG/ML injection 15 mg (15 mg Intravenous Given 09/11/19 1603)  iohexol (OMNIPAQUE) 350 MG/ML injection 75 mL (75 mLs Intravenous Contrast Given 09/11/19 1530)    Pertinent labs & imaging results that were available during my care of the patient were reviewed by me and considered in my medical decision making (see chart for details).  Stuart Mclaughlin was evaluated in Emergency Department on 09/11/2019 for the symptoms described in the history of present illness. He was evaluated in the context of the global COVID-19 pandemic, which necessitated consideration that the patient might be at risk for infection with the SARS-CoV-2 virus that causes COVID-19. Institutional protocols and algorithms that pertain to the evaluation of  patients at risk for COVID-19 are in a state of rapid change based on information released by regulatory bodies including the CDC and federal and state organizations. These policies and algorithms were followed during the patient's care in the ED.   Patient presents with sudden onset of radiating chest pain.  He has a history of DVT, and I will obtain a CT angiogram to evaluate for PE.  Less likely aortic dissection or non-STEMI, will obtain serial troponins.  With a left elbow bursitis, I doubt infected bursa, septic arthritis, osteomyelitis, abscess.  Clinical Course as of Sep 11 1711  Mon Sep 11, 2019  1703 Patient sleeping comfortably, easily aroused.  Pain resolved.  CT scan unremarkable, no PE or dissection.  Delta troponin negative.  Stable for discharge home, continue NSAIDs, ice, compression for left elbow bursitis.  With reassuring work-up, pain episode most likely due to muscle strain and spasm.   [PS]    Clinical Course User Index [PS] Sharman CheekStafford, Ytzel Gubler, MD     ____________________________________________   FINAL CLINICAL IMPRESSION(S) / ED DIAGNOSES    Final diagnoses:  Olecranon bursitis of left elbow  Atypical chest pain     ED Discharge Orders         Ordered    naproxen (NAPROSYN) 500 MG tablet  2 times daily with meals     Discontinue  Reprint     09/11/19 1706          Portions of this note were generated with dragon dictation software. Dictation errors may occur despite best attempts at proofreading.   Sharman CheekStafford, Shaunte Tuft, MD 09/11/19 1714

## 2019-09-11 NOTE — ED Notes (Signed)
E-signature not working at this time. Pt verbalized understanding of D/C instructions, prescriptions and follow up care with no further questions at this time. Pt in NAD and ambulatory at time of D/C.  

## 2019-09-11 NOTE — Discharge Instructions (Signed)
Your lab tests, EKG, and CT scan of the chest were all unremarkable today.  Continue to apply ice intermittently to the left elbow and use a compression elastic bandage (Ace wrap) to support and protect the area until the swollen bursa recedes.

## 2019-09-11 NOTE — ED Triage Notes (Signed)
Pt in via [facility] from work, reports sudden onset chest pain w/ diaphoresis today.  Pt also presents with swelling to left elbow and upper arm x 2 days.  Reports hx of same with fluid removal to right elbow.  NAD noted at this time.

## 2019-09-11 NOTE — ED Triage Notes (Signed)
Pt in via EMS from work with c/o 10/10 CP and diaphoresis. Pt called him mom who called 911. BP 181/110 upon fire arrival. Pt also has some arm swelling last pm. 75 HR, 100% RA, 168/112. EMS administered 324 asa

## 2019-09-11 NOTE — Progress Notes (Signed)
Stuart Mclaughlin    [id number]   09/12/1985  Type of procedure: CT Angio Chest  09/11/2019  During your examination at [facility] some of the x-ray dye leaked into the tissues around the vein that the x-ray dye was given through.  What should you do?  1. Apply ice 20 minutes four times a day for three days.  2. Keep the affected extremity elevated above the rest of your body for 48 hours.  3. If you develop any of the following signs or symptoms, please contact Radiology at [phone].  Increased pain  Increasing swelling   Change in sensation (ex. Numbness, tingling)  Development of redness or increase in warmth around the affected area  Increasing hardness  Blistering   I have read and understand these instructions.  Any questions I raised have been discussed to my satisfaction.  I understand that failure to follow these instructions may result in additional complications.

## 2019-09-11 NOTE — Progress Notes (Signed)
This patient has received 50-75 ml's of IV Omni 350 contrast extravasation into right posterior forearm during a Chest CT exam.  The exam was performed on 09/11/2019 1540  Right forearm assessed by Radiologist Assistant Loyce Dys.

## 2019-09-11 NOTE — Progress Notes (Signed)
PA called to CT for contrast extravasation.  Patient with IV in place to R forearm with area of swelling proximal to IV site.  IV removed.   Skin marked for ongoing monitoring.  Patient states some mild, expected tenderness.  No exquisite tenderness, no skin breakdown.  ROM intact.  Sensation intact.  Patient given care instructions and is to raise elbow above the level of the heart once CT completed.  Ice pack applied.   CT tech to call for follow-up.   Loyce Dys, MS RD PA-C

## 2019-09-13 NOTE — Progress Notes (Signed)
Patient called a couple times on 09/12/19, 09/13/19 on both his main phone number and work number with no answer. Left message on work number 09/13/19.   Patient had our CT phone number if there was any concern with his arm after IV infiltrated on 09/11/19.   Patient had education of what to do and who to call if there was any concern.

## 2020-06-27 ENCOUNTER — Emergency Department
Admission: EM | Admit: 2020-06-27 | Discharge: 2020-06-27 | Disposition: A | Discharge status: Home / Self Care | Payer: BC Managed Care – PPO | Attending: Emergency Medicine | Admitting: Emergency Medicine

## 2020-06-27 ENCOUNTER — Other Ambulatory Visit: Payer: Self-pay

## 2020-06-27 ENCOUNTER — Encounter: Payer: Self-pay | Admitting: *Deleted

## 2020-06-27 DIAGNOSIS — M79604 Pain in right leg: Secondary | ICD-10-CM | POA: Insufficient documentation

## 2020-06-27 DIAGNOSIS — Z87891 Personal history of nicotine dependence: Secondary | ICD-10-CM | POA: Diagnosis not present

## 2020-06-27 DIAGNOSIS — Z79899 Other long term (current) drug therapy: Secondary | ICD-10-CM | POA: Insufficient documentation

## 2020-06-27 DIAGNOSIS — M545 Low back pain, unspecified: Secondary | ICD-10-CM | POA: Diagnosis not present

## 2020-06-27 DIAGNOSIS — M5441 Lumbago with sciatica, right side: Secondary | ICD-10-CM | POA: Insufficient documentation

## 2020-06-27 DIAGNOSIS — M5430 Sciatica, unspecified side: Secondary | ICD-10-CM

## 2020-06-27 DIAGNOSIS — I10 Essential (primary) hypertension: Secondary | ICD-10-CM | POA: Insufficient documentation

## 2020-06-27 MED ORDER — BACLOFEN 10 MG PO TABS: 10.0000 mg | ORAL_TABLET | Freq: Three times a day (TID) | ORAL | 0 refills | Status: AC

## 2020-06-27 MED ORDER — MELOXICAM 15 MG PO TABS: 15.0000 mg | ORAL_TABLET | Freq: Every day | ORAL | 2 refills | Status: DC

## 2020-06-27 NOTE — Discharge Instructions (Signed)
Follow-up with your regular doctor as needed Follow-up with orthopedics if not improving in 5 to 7 days Apply ice to your lower back Try to do stretches Take your medication as prescribed Return if worsening

## 2020-06-27 NOTE — ED Provider Notes (Signed)
[facility] Emergency Department Provider Note  ____________________________________________   Event Date/Time   First MD Initiated Contact with Patient 06/27/20 1914     (approximate)  I have reviewed the triage vital signs and the nursing notes.   HISTORY  Chief Complaint Back Pain and Leg Pain    HPI Stuart Mclaughlin is a 35 y.o. male  C/o low back pain for 3 day, no known injury, pain is worse with movement, increased with bending over, denies numbness, tingling, or changes in bowel/urinary habits, states pain radiates down the back of the right leg, using otc meds without relief Remainder ros neg   Past Medical History:  Diagnosis Date  . Allergy   . Arthritis   . Deep vein thrombosis (DVT) (HCC)   . Hypertension     There are no problems to display for this patient.   Past Surgical History:  Procedure Laterality Date  . PERIPHERAL VASCULAR CATHETERIZATION  04/01/2015   Procedure: Lower Extremity Venography;  Surgeon: Annice Needy, MD;  Location: [facility] INVASIVE CV LAB;  Service: Cardiovascular;;  . PERIPHERAL VASCULAR CATHETERIZATION Right 04/01/2015   Procedure: Lower Extremity Intervention;  Surgeon: Annice Needy, MD;  Location: [facility] INVASIVE CV LAB;  Service: Cardiovascular;  Laterality: Right;  . PERIPHERAL VASCULAR CATHETERIZATION N/A 04/01/2015   Procedure: IVC Filter Insertion;  Surgeon: Annice Needy, MD;  Location: [facility] INVASIVE CV LAB;  Service: Cardiovascular;  Laterality: N/A;    Prior to Admission medications   Medication Sig Start Date End Date Taking? Authorizing Provider  baclofen (LIORESAL) 10 MG tablet Take 1 tablet (10 mg total) by mouth 3 (three) times daily for 7 days. 06/27/20 07/04/20 Yes Shanzay Hepworth, Roselyn Bering, PA-C  meloxicam (MOBIC) 15 MG tablet Take 1 tablet (15 mg total) by mouth daily. 06/27/20 06/27/21 Yes Calianna Kim, Roselyn Bering, PA-C  amLODipine (NORVASC) 5 MG tablet TAKE 1 TABLET BY MOUTH EVERY DAY 06/26/19   Maple Hudson., MD    Allergies Patient has no known allergies.  History reviewed. No pertinent family history.  Social History Social History   Tobacco Use  . Smoking status: Former Smoker    Types: Cigarettes  . Smokeless tobacco: Never Used  Vaping Use  . Vaping Use: Never used  Substance Use Topics  . Alcohol use: Yes    Alcohol/week: 2.0 standard drinks    Types: 2 Cans of beer per week  . Drug use: No    Review of Systems  Constitutional: No fever/chills Eyes: No visual changes. ENT: No sore throat. Respiratory: Denies cough Genitourinary: Negative for dysuria. Musculoskeletal: Positive for back pain. Skin: Negative for rash.    ____________________________________________   PHYSICAL EXAM:  VITAL SIGNS: ED Triage Vitals  Enc Vitals Group     BP 06/27/20 1753 (!) 131/93     Pulse Rate 06/27/20 1753 84     Resp 06/27/20 1753 16     Temp 06/27/20 1753 98.4 F (36.9 C)     Temp Source 06/27/20 1753 Oral     SpO2 06/27/20 1753 97 %     Weight 06/27/20 1754 285 lb (129.3 kg)     Height 06/27/20 1754 6\' 4"  (1.93 m)     Head Circumference --      Peak Flow --      Pain Score 06/27/20 1754 7     Pain Loc --      Pain Edu? --      Excl. in GC? --  Constitutional: Alert and oriented. Well appearing and in no acute distress. Eyes: Conjunctivae are normal.  Head: Atraumatic. Nose: No congestion/rhinnorhea. Mouth/Throat: Mucous membranes are moist.   Neck:  supple no lymphadenopathy noted Cardiovascular: Normal rate, regular rhythm. Heart sounds are normal Respiratory: Normal respiratory effort.  No retractions, lungs c t a  Abd: soft nontender bs normal all 4 quad GU: deferred Musculoskeletal: FROM all extremities, warm and well perfused.  Decreased rom of back due to discomfort, lumbar spine nontender, negative slr, however 5 strength in great toes b/l, 5/5 strength in lower legs, n/v intact Neurologic:  Normal speech and language.  Skin:  Skin is warm, dry  and intact. No rash noted. Psychiatric: Mood and affect are normal. Speech and behavior are normal.  ____________________________________________   LABS (all labs ordered are listed, but only abnormal results are displayed)  Labs Reviewed - No data to display ____________________________________________   ____________________________________________  RADIOLOGY    ____________________________________________   PROCEDURES  Procedure(s) performed: No  Procedures    ____________________________________________   INITIAL IMPRESSION / ASSESSMENT AND PLAN / ED COURSE  Pertinent labs & imaging results that were available during my care of the patient were reviewed by me and considered in my medical decision making (see chart for details).   Patient is 35 year old male presents with low back pain that radiates to the back of the right leg.  See HPI.  Physical exam shows patient per stable.  He is able to ambulate without difficulty.  Did explain the findings to the patient.  Told him this is more of a sciatica.  He was given a prescription for meloxicam and baclofen.  Apply ice to the lower back.  Was given a work note and discharged stable condition with instructions to return if worsening.  Follow-up with orthopedics if not improving in 5 to 7 days.  He was discharged in stable condition     As part of my medical decision making, I reviewed the following data within the electronic MEDICAL RECORD NUMBER Nursing notes reviewed and incorporated, Old chart reviewed, Notes from prior ED visits and [place] Controlled Substance Database  ____________________________________________   FINAL CLINICAL IMPRESSION(S) / ED DIAGNOSES  Final diagnoses:  Acute sciatica      NEW MEDICATIONS STARTED DURING THIS VISIT:  New Prescriptions   BACLOFEN (LIORESAL) 10 MG TABLET    Take 1 tablet (10 mg total) by mouth 3 (three) times daily for 7 days.   MELOXICAM (MOBIC) 15 MG TABLET    Take 1 tablet  (15 mg total) by mouth daily.     Note:  This document was prepared using Dragon voice recognition software and may include unintentional dictation errors.     Faythe Ghee, PA-C 06/27/20 1952    Sharman Cheek, MD 06/28/20 [id number]

## 2020-06-27 NOTE — ED Triage Notes (Signed)
Lower back pain radiating down the right leg x3 three days. NO known injury. Pt is able to bear weight. NO medications taken at home.   Movement and standing make the pain worse.

## 2020-08-02 IMAGING — DX DG KNEE COMPLETE 4+V*R*
4 series · 4 of 4 positions shown · non-contrast
Comparison: None.

CLINICAL DATA: Right knee pain and swelling for several days.
History of DVT. No reported injury.

EXAM:
RIGHT KNEE - COMPLETE 4+ VIEW

[knee ap]
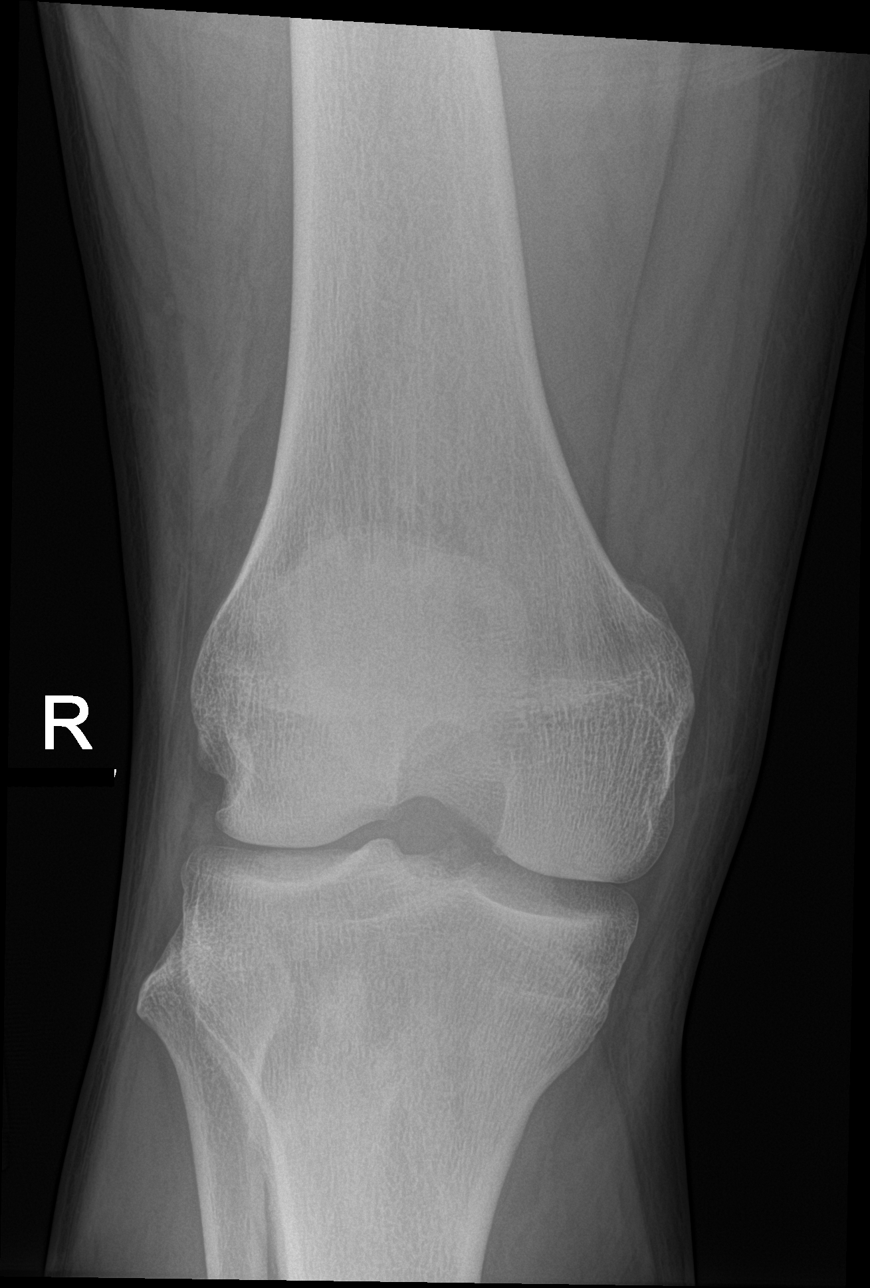

[knee lat]
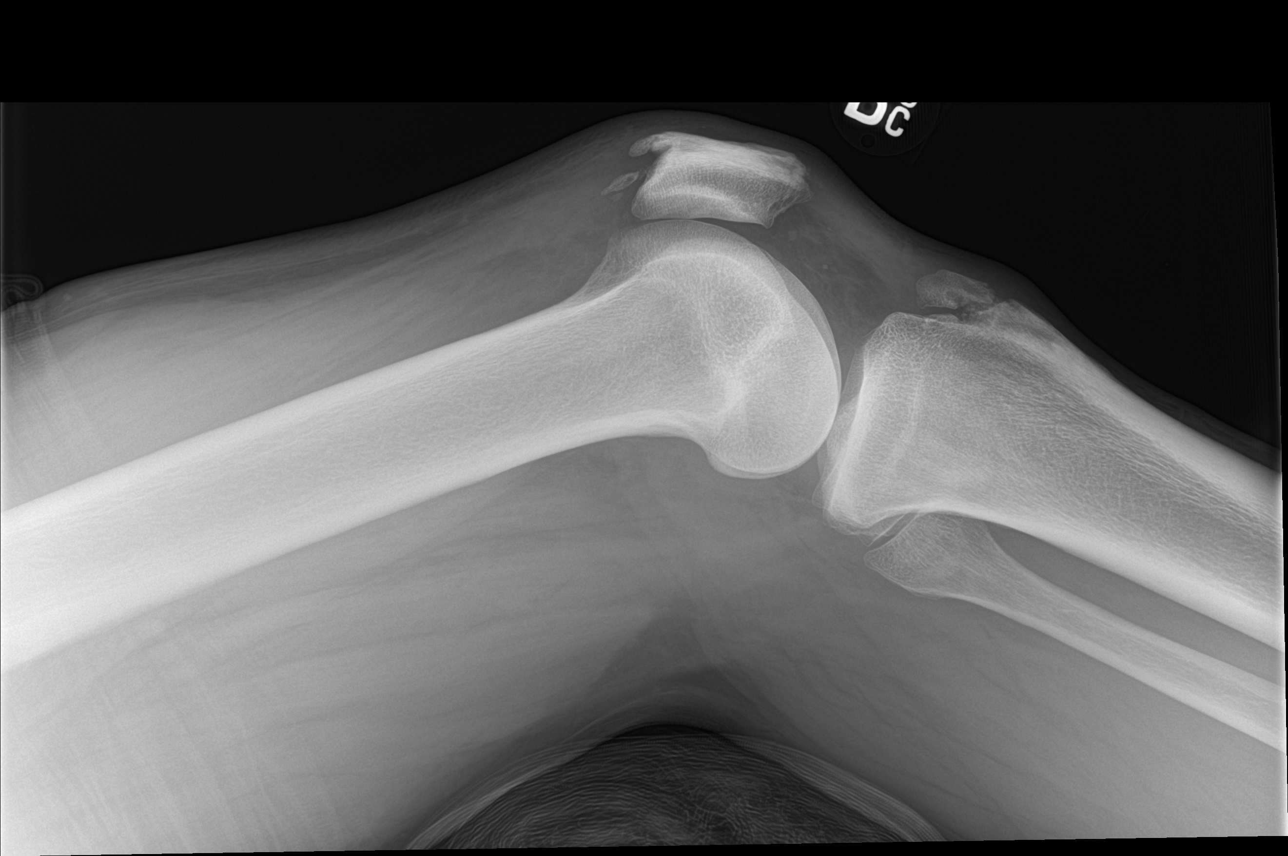

[knee obl (1 of 2)]
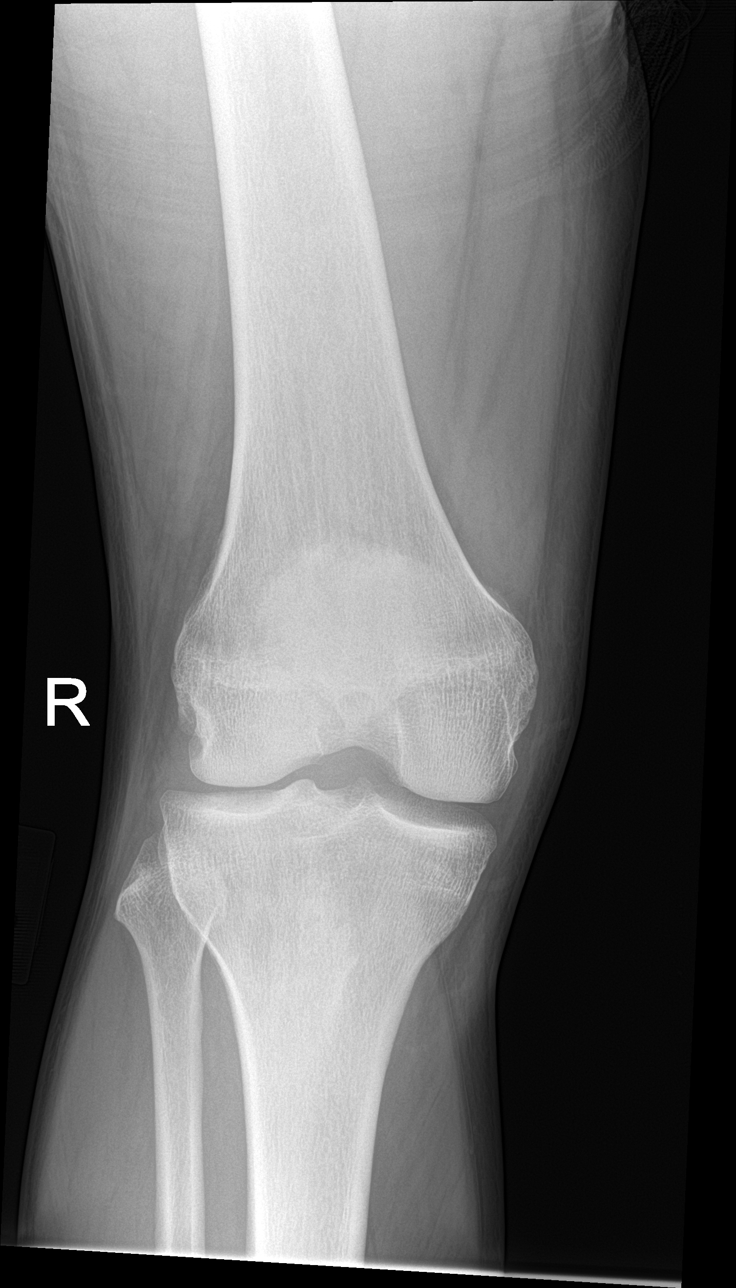

[knee obl (2 of 2)]
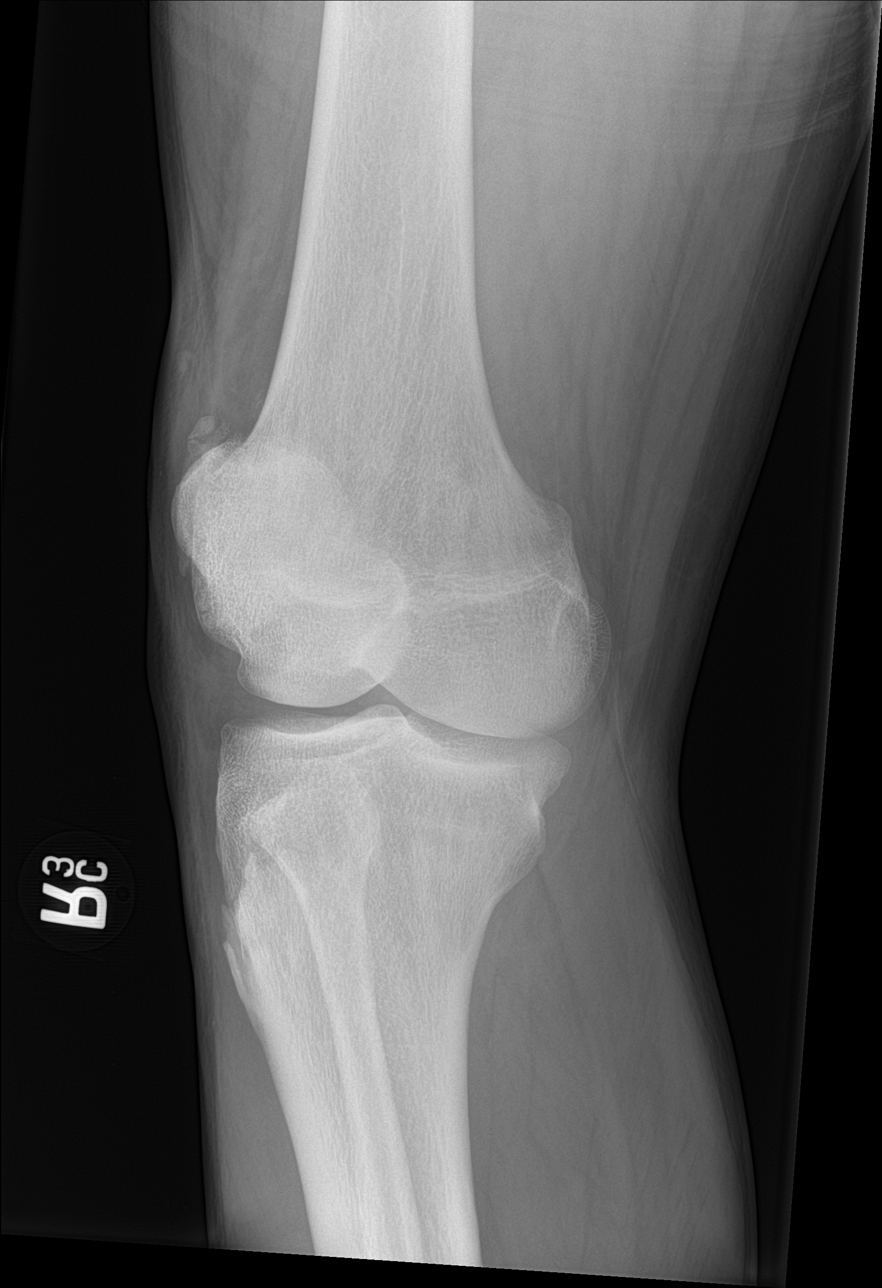

[4 of 4 positions shown; findings below may reference images not displayed]

FINDINGS: No fracture, joint effusion or dislocation. Moderate superior
patellar and tibial tubercle enthesophytes. No suspicious focal
osseous lesions. No significant arthropathy. No radiopaque foreign
body.
IMPRESSION: No right knee fracture, joint effusion or malalignment. Moderate
enthesophytes as detailed.

## 2020-09-30 ENCOUNTER — Ambulatory Visit
Admission: RE | Admit: 2020-09-30 | Discharge: 2020-09-30 | Disposition: A | Discharge status: Home / Self Care | Payer: BC Managed Care – PPO | Source: Ambulatory Visit | Attending: Medical | Admitting: Medical

## 2020-09-30 ENCOUNTER — Encounter: Payer: Self-pay | Admitting: Medical

## 2020-09-30 ENCOUNTER — Other Ambulatory Visit (HOSPITAL_COMMUNITY): Payer: Self-pay | Admitting: Medical

## 2020-09-30 ENCOUNTER — Other Ambulatory Visit: Payer: Self-pay

## 2020-09-30 ENCOUNTER — Ambulatory Visit: Payer: BLUE CROSS/BLUE SHIELD | Admitting: Medical

## 2020-09-30 ENCOUNTER — Other Ambulatory Visit: Payer: Self-pay | Admitting: Medical

## 2020-09-30 VITALS — BP 142/96 | HR 69 | Temp 98.5°F | Resp 16 | Ht 73.0 in | Wt 288.0 lb

## 2020-09-30 DIAGNOSIS — M7989 Other specified soft tissue disorders: Secondary | ICD-10-CM | POA: Diagnosis not present

## 2020-09-30 DIAGNOSIS — Z86718 Personal history of other venous thrombosis and embolism: Secondary | ICD-10-CM

## 2020-09-30 DIAGNOSIS — M79601 Pain in right arm: Secondary | ICD-10-CM

## 2020-09-30 MED ORDER — AMOXICILLIN-POT CLAVULANATE 875-125 MG PO TABS: 1.0000 | ORAL_TABLET | Freq: Two times a day (BID) | ORAL | 0 refills | Status: DC

## 2020-09-30 MED ORDER — MELOXICAM 7.5 MG PO TABS: 7.5000 mg | ORAL_TABLET | Freq: Every day | ORAL | 0 refills | Status: DC

## 2020-09-30 NOTE — Progress Notes (Signed)
   Subjective:    Patient ID: Stuart Mclaughlin, male    DOB: Nov 02, 1985, 35 y.o.   MRN: [id number]  HPI  35 yo male in non acute distress. Right arm pain  that has worsened  x 2 days. Blood pressure (!) 142/96, pulse 69, temperature 98.5 F (36.9 C), temperature source Oral, resp. rate 16, height 6\' 1"  (1.854 m), weight 288 lb (130.6 kg), SpO2 96 %.  Stopped blood pressure on his own.  Works at no injury, his job is he picks up garbage..  Review of Systems  Musculoskeletal:        Right distal upper  arm   Skin:  Negative for color change, pallor, rash and wound.      Objective:   Physical Exam Constitutional:      Appearance: Normal appearance.  HENT:     Head: Normocephalic and atraumatic.  Eyes:     Extraocular Movements: Extraocular movements intact.     Conjunctiva/sclera: Conjunctivae normal.     Pupils: Pupils are equal, round, and reactive to light.  Skin:    Capillary Refill: Capillary refill takes less than 2 seconds.  Neurological:     General: No focal deficit present.     Mental Status: He is alert and oriented to person, place, and time.  Psychiatric:        Mood and Affect: Mood normal.        Behavior: Behavior normal.        Thought Content: Thought content normal.    Right distal upper arm is  swollen  into hand and warm,  equal grip strength  2+ radial pulse, limited ROM secondary  to pain when  straightening out arm.      Assessment & Plan:   Pain Right arm distally H/O DVT in both legs. Will do U/S venous duplex of right upper extremity. Will call patient with results if negative will place patient on antibiotics and pain medication. Work note to be off so he may ice and elevate the area with a pillow. Meds ordered this encounter  Medications   amoxicillin-clavulanate (AUGMENTIN) 875-125 MG tablet    Sig: Take 1 tablet by mouth 2 (two) times daily. Take with food    Dispense:  20 tablet    Refill:  0   meloxicam  (MOBIC) 7.5 MG tablet    Sig: Take 1 tablet (7.5 mg total) by mouth daily. Take with foood    Dispense:  30 tablet    Refill:  0    Hypertension encouragaed patient to restart blood pressure medication and to follow up with Dr. [employer].   After hours work ,  I spoke to radiology Sullivan Lone ( rad tech). U/S right upper extremity  was Negative  for DVT.Spoke to patient. Patient to start medications and follow up in 2 days at the clinic. Patient verbalizes understanding and has no questions at the end of our phone conversation.

## 2020-10-01 NOTE — Patient Instructions (Signed)
Deep Vein Thrombosis  Deep vein thrombosis (DVT) is a condition in which a blood clot forms in a deep vein, such as a vein in the lower leg, thigh, pelvis, or arm. Deep veins are veins in the deep venous system. A clot is blood that has thickened into a gel or solid. This condition is serious and can be life-threatening if the clot travels to the lungs and causes a blockage (pulmonary embolism) in the arteries of the lung. A DVT can also damage veins in the leg. This can lead to long-term, or chronic, venous disease, leg pain, swelling, discoloration, and ulcers or sores (post-thrombotic syndrome). What are the causes? This condition may be caused by: A slowdown of blood flow. Damage to a vein. A condition that causes blood to clot more easily, such as certain blood-clotting disorders. What increases the risk? The following factors may make you more likely to develop this condition: Having obesity. Being older, especially older than age 60. Being inactive (sedentary lifestyle) or not moving around. This may include: Sitting or lying down for longer than 4-6 hours other than to sleep at night. Being in the hospital, having major or lengthy surgery, or having a thin, flexible tube (central line catheter) placed in a large vein. Being pregnant, giving birth, or having recently given birth. Taking medicines that contain estrogen, such as birth control or hormone replacement therapy. Using products that contain nicotine or tobacco, especially if you use hormonal birth control. Having a history of blood clots or a blood-clotting disease, a blood vessel disease (peripheral vascular disease), or congestive heart disease. Having a history of cancer, especially if being treated with chemotherapy. What are the signs or symptoms? Symptoms of this condition include: Swelling, pain, pressure, or tenderness in an arm or a leg. An arm or a leg becoming warm, red, or discolored. A leg turning very pale. You  may have a large DVT. This is rare. If the clot is in your leg, you may notice symptoms more or have worse symptomswhen you stand or walk. In some cases, there are no symptoms. How is this diagnosed? This condition is diagnosed with: Your medical history and a physical exam. Tests, such as: Blood tests to check how well your blood clots. Doppler ultrasound. This is the best way to find a DVT. Venogram. Contrast dye is injected into a vein, and X-rays are taken to check for clots. How is this treated? Treatment for this condition depends on: The cause of your DVT. The size and location of your DVT, or having more than one DVT. Your risk for bleeding or developing more clots. Other medical conditions you may have. Treatment may include: Taking a blood thinner, also called an anticoagulant, to prevent clots from forming and growing. Wearing compression stockings, if directed. Injecting medicines into the affected vein to break up the clot (catheter-directed thrombolysis). This is used only for severe DVT and only if a specialist recommends it. Specific surgical procedures, when DVT is severe or hard to treat. These may be done to: Isolate and remove your clot. Place an inferior vena cava (IVC) filter in a large vein to catch blood clots before they reach your lungs. You may get some medical treatments for 6 months or longer. Follow these instructions at home: If you are taking blood thinners: Talk with your health care provider before you take any medicines that contain aspirin or NSAIDs, such as ibuprofen. These medicines increase your risk for dangerous bleeding. Take your medicine exactly as   told, at the same time every day. Do not skip a dose. Do not take more than the prescribed dose. This is important. Ask your health care provider about foods and medicines that could change the way your blood thinner works (may interact). Avoid these foods and medicines if you are told to do  so. Avoid anything that may cause bleeding or bruising. You may bleed more easily while taking blood thinners. Be very careful when using knives, scissors, or other sharp objects. Use an electric razor instead of a blade. Avoid activities that could cause injury or bruising, and follow instructions for preventing falls. Tell your health care provider if you have had any internal bleeding, bleeding ulcers, or neurologic diseases, such as strokes or cerebral aneurysms. Wear a medical alert bracelet or carry a card that lists what medicines you take. General instructions Take over-the-counter and prescription medicines only as told by your health care provider. Return to your normal activities as told by your health care provider. Ask your health care provider what activities are safe for you. If recommended, wear compression stockings as told by your health care provider. These stockings help to prevent blood clots and reduce swelling in your legs. Keep all follow-up visits as told by your health care provider. This is important. Contact a health care provider if: You miss a dose of your blood thinner. You have new or worse pain, swelling, or redness in an arm or a leg. You have worsening numbness or tingling in an arm or a leg. You have unusual bruising. Get help right away if: You have signs or symptoms that a blood clot has moved to the lungs. These may include: Shortness of breath. Chest pain. Fast or irregular heartbeats (palpitations). Light-headedness or dizziness. Coughing up blood. You have signs or symptoms that your blood is too thin. These may include: Blood in your vomit, stool, or urine. A cut that will not stop bleeding. A menstrual period that is heavier than usual. A severe headache or confusion. These symptoms may represent a serious problem that is an emergency. Do not wait to see if the symptoms will go away. Get medical help right away. Call your local emergency  services (911 in the [place] Do not drive yourself to the hospital. Summary Deep vein thrombosis (DVT) happens when a blood clot forms in a deep vein. This may occur in the lower leg, thigh, pelvis, or arm. Symptoms affect the arm or leg and can include swelling, pain, tenderness, warmth, redness, or discoloration. This condition may be treated with medicines or compression stockings. In severe cases, surgery may be done. If you are taking blood thinners, take them exactly as told. Do not skip a dose. Do not take more than is prescribed. Get help right away if you have shortness of breath, chest pain, fast or irregular heartbeats, or blood in your vomit, urine, or stool. This information is not intended to replace advice given to you by your health care provider. Make sure you discuss any questions you have with your healthcare provider. Document Revised: 01/28/2019 Document Reviewed: 01/28/2019 Elsevier Patient Education  2022 [employer]vier Inc.  

## 2020-10-02 ENCOUNTER — Ambulatory Visit: Payer: BLUE CROSS/BLUE SHIELD | Admitting: Medical

## 2020-10-03 ENCOUNTER — Ambulatory Visit: Payer: BLUE CROSS/BLUE SHIELD | Admitting: Nurse Practitioner

## 2020-10-03 ENCOUNTER — Encounter: Payer: Self-pay | Admitting: Nurse Practitioner

## 2020-10-03 ENCOUNTER — Other Ambulatory Visit: Payer: Self-pay

## 2020-10-03 VITALS — BP 128/96 | HR 82 | Temp 98.3°F | Resp 16 | Ht 76.0 in | Wt 286.0 lb

## 2020-10-03 DIAGNOSIS — M79601 Pain in right arm: Secondary | ICD-10-CM

## 2020-10-03 NOTE — Progress Notes (Signed)
   Subjective:    Patient ID: Stuart Mclaughlin, male    DOB: [birth year], 35 y.o.   MRN: [id number]  HPI  35 year old male returning to [facility] for follow up after visit on 10/01/2020 regarding pain and swelling in right forearm.   Patient works for [employer] on [employer], rides in [place] vehicle for many hours during the day. Had lower leg DVT in the past without long term anti-coagulation therapy.   Initial concern was for tromboses in arm, patient had venous ultrasound performed 10/01/20 negative for DVT.  Patient was started on Augmentin and Mobic- subjectively much improved today pain has lessened he is continuing antibiotic and tolerating well.   Today's Vitals   10/03/20 0909  BP: (!) 128/96  Pulse: 82  Resp: 16  Temp: 98.3 F (36.8 C)  TempSrc: Tympanic  SpO2: 97%  Weight: 286 lb (129.7 kg)  Height: 6\' 4"  (1.93 m)  PainSc: 2    Body mass index is 34.81 kg/m.      Review of Systems  Constitutional: Negative.   HENT: Negative.    Respiratory: Negative.    Cardiovascular: Negative.   Musculoskeletal:        Right forearm and elbow pain  Neurological: Negative.    Past Medical History:  Diagnosis Date   Allergy    Arthritis    Deep vein thrombosis (DVT) (HCC)    Hypertension        Objective:   Physical Exam HENT:     Head: Normocephalic.  Musculoskeletal:        General: Normal range of motion.     Right elbow: Normal. No swelling or effusion. No tenderness.     Left elbow: Normal. No swelling or effusion. No tenderness.     Right forearm: Normal. No swelling, edema or tenderness.     Left forearm: Normal. No swelling, edema or tenderness.  Skin:    General: Skin is warm and dry.     Capillary Refill: Capillary refill takes less than 2 seconds.  Neurological:     General: No focal deficit present.     Mental Status: He is alert.          Assessment & Plan:   1. Right arm pain Symptoms are improving finish  antibiotics and return to clinic as needed with any new or worsening symptoms   Advised follow up with PCP to discuss recurrent swelling in extremities and need for annual physical

## 2021-02-13 ENCOUNTER — Encounter: Payer: BC Managed Care – PPO | Admitting: Family Medicine

## 2021-03-10 ENCOUNTER — Emergency Department: Payer: BC Managed Care – PPO

## 2021-03-10 ENCOUNTER — Other Ambulatory Visit: Payer: Self-pay

## 2021-03-10 ENCOUNTER — Emergency Department
Admission: EM | Admit: 2021-03-10 | Discharge: 2021-03-10 | Disposition: A | Discharge status: Home / Self Care | Payer: BC Managed Care – PPO | Attending: Emergency Medicine | Admitting: Emergency Medicine

## 2021-03-10 DIAGNOSIS — M7662 Achilles tendinitis, left leg: Secondary | ICD-10-CM

## 2021-03-10 DIAGNOSIS — M79662 Pain in left lower leg: Secondary | ICD-10-CM | POA: Diagnosis not present

## 2021-03-10 DIAGNOSIS — M79605 Pain in left leg: Secondary | ICD-10-CM | POA: Diagnosis not present

## 2021-03-10 MED ORDER — MELOXICAM 15 MG PO TABS: 15.0000 mg | ORAL_TABLET | Freq: Every day | ORAL | 0 refills | Status: AC

## 2021-03-10 NOTE — ED Triage Notes (Signed)
Pt to ED for possible blood clot to left leg, hx of same. Reports pain x2 days.

## 2021-03-10 NOTE — ED Provider Notes (Signed)
[facility] Provider Note  Patient Contact: 3:37 PM (approximate)   History   Leg Pain   HPI  Stuart Mclaughlin is a 36 y.o. male who presents the emergency department complaining of heel and lower leg pain.  Patient has a history of DVT, wants to ensure that there is no blood clot.  He has had no edema, no warmth, no prolonged immobilization, recent surgeries.  Patient states that it is more of a burning sensation along the Achilles tendon distribution.  No acute injury to the area.  No other complaints at this time.  Patient denies any chest pain, shortness of breath.Review of patient's medical records appears that patient had DVT in 2017.     Physical Exam   Triage Vital Signs: ED Triage Vitals  Enc Vitals Group     BP 03/10/21 1411 (!) 133/93     Pulse Rate 03/10/21 1410 66     Resp 03/10/21 1410 20     Temp 03/10/21 1410 98.7 F (37.1 C)     Temp Source 03/10/21 1410 Oral     SpO2 03/10/21 1410 97 %     Weight 03/10/21 1410 270 lb (122.5 kg)     Height 03/10/21 1410 6\' 4"  (1.93 m)     Head Circumference --      Peak Flow --      Pain Score 03/10/21 1410 7     Pain Loc --      Pain Edu? --      Excl. in GC? --     Most recent vital signs: Vitals:   03/10/21 1410 03/10/21 1411  BP:  (!) 133/93  Pulse: 66   Resp: 20   Temp: 98.7 F (37.1 C)   SpO2: 97%      General: Alert and in no acute distress. Cardiovascular:  Good peripheral perfusion Respiratory: Normal respiratory effort without tachypnea or retractions. Lungs CTAB.  Musculoskeletal: Full range of motion to all extremities. Visualization of the left lower extremity reveals no erythema, edema.  No warmth to palpation.  Patient is tender primarily over the lower gastrocnemius muscle extending through the Achilles tendon.  No deficits appreciated.  Full range of motion to the ankle joint.  Dorsalis pedis pulses sensation intact at the foot. Neurologic:  No gross focal neurologic  deficits are appreciated.  Skin:   No rash noted Other:   ED Results / Procedures / Treatments   Labs (all labs ordered are listed, but only abnormal results are displayed) Labs Reviewed - No data to display   EKG     RADIOLOGY  I personally viewed and evaluated these images as part of my medical decision making, as well as reviewing the written report by the radiologist.  ED Provider Interpretation: No evidence of DVT on ultrasound of the left lower extremity  03/12/21 Venous Img Lower Unilateral Left  Result Date: 03/10/2021 CLINICAL DATA:  Left lower extremity pain x2 days. EXAM: Left LOWER EXTREMITY VENOUS DOPPLER ULTRASOUND TECHNIQUE: Gray-scale sonography with compression, as well as color and duplex ultrasound, were performed to evaluate the deep venous system(s) from the level of the common femoral vein through the popliteal and proximal calf veins. COMPARISON:  None. FINDINGS: VENOUS Normal compressibility of the common femoral, superficial femoral, and popliteal veins, as well as the visualized calf veins. Visualized portions of profunda femoral vein and great saphenous vein unremarkable. No filling defects to suggest DVT on grayscale or color Doppler imaging. Doppler waveforms show normal  direction of venous flow, normal respiratory plasticity and response to augmentation. Limited views of the contralateral common femoral vein are unremarkable. OTHER None. Limitations: none IMPRESSION: Negative. Electronically Signed   By: Maudry Mayhew M.D.   On: 03/10/2021 14:58    PROCEDURES:  Critical Care performed: No  Procedures   MEDICATIONS ORDERED IN ED: Medications - No data to display   IMPRESSION / MDM / ASSESSMENT AND PLAN / ED COURSE  I reviewed the triage vital signs and the nursing notes.                              Differential diagnosis includes, but is not limited to, DVT, gastrocnemius tear, Achilles tendon rupture, Achilles tendinitis   Patient's diagnosis is  consistent with Achilles tendinitis.  Patient presented to the emergency department complaining of left leg pain.  Patient states that this is been ongoing for 2 to 3 days.  History of DVT from 2017 on review of medical record.  Patient had seen vascular surgery in February 2017 for what appears to be a thrombectomy.  There was no warmth, edema but given his history ultrasound was ordered to ensure no DVT.  This is reassuring with no clot at this time.  Findings on physical exam are now most consistent with Achilles tendinitis..  Patient should follow-up primary care as needed.  Return precautions discussed with the patient.  Patient will be prescribed meloxicam for symptom relief.  Patient is given ED precautions to return to the ED for any worsening or new symptoms.        FINAL CLINICAL IMPRESSION(S) / ED DIAGNOSES   Final diagnoses:  Achilles tendinitis of left lower extremity     Rx / DC Orders   ED Discharge Orders          Ordered    meloxicam (MOBIC) 15 MG tablet  Daily        03/10/21 1544             Note:  This document was prepared using Dragon voice recognition software and may include unintentional dictation errors.   Lanette Hampshire 03/10/21 1544    Jene Every, MD 03/10/21 [phone]

## 2021-03-10 NOTE — ED Notes (Signed)
See triage note  presents with pain to left ankle and lateral lower leg  min swelling noted  no redness

## 2021-07-28 ENCOUNTER — Emergency Department
Admission: EM | Admit: 2021-07-28 | Discharge: 2021-07-28 | Disposition: A | Discharge status: Home / Self Care | Payer: BC Managed Care – PPO | Attending: Emergency Medicine | Admitting: Emergency Medicine

## 2021-07-28 ENCOUNTER — Encounter: Payer: Self-pay | Admitting: Emergency Medicine

## 2021-07-28 ENCOUNTER — Emergency Department: Payer: BC Managed Care – PPO

## 2021-07-28 ENCOUNTER — Other Ambulatory Visit: Payer: Self-pay

## 2021-07-28 DIAGNOSIS — K760 Fatty (change of) liver, not elsewhere classified: Secondary | ICD-10-CM | POA: Diagnosis not present

## 2021-07-28 DIAGNOSIS — R1031 Right lower quadrant pain: Secondary | ICD-10-CM | POA: Diagnosis not present

## 2021-07-28 DIAGNOSIS — R1084 Generalized abdominal pain: Secondary | ICD-10-CM | POA: Diagnosis not present

## 2021-07-28 LAB — COMPREHENSIVE METABOLIC PANEL
ALT: 73 U/L — ABNORMAL HIGH (ref 0–44)
AST: 46 U/L — ABNORMAL HIGH (ref 15–41)
Albumin: 4 g/dL (ref 3.5–5.0)
Alkaline Phosphatase: 73 U/L (ref 38–126)
Anion gap: 10 (ref 5–15)
BUN: 13 mg/dL (ref 6–20)
CO2: 22 mmol/L (ref 22–32)
Calcium: 9.1 mg/dL (ref 8.9–10.3)
Chloride: 105 mmol/L (ref 98–111)
Creatinine, Ser: 1.37 mg/dL — ABNORMAL HIGH (ref 0.61–1.24)
GFR, Estimated: >60 mL/min (ref >60)
Glucose, Bld: 208 mg/dL — ABNORMAL HIGH (ref 70–99)
Potassium: 3.4 mmol/L — ABNORMAL LOW (ref 3.5–5.1)
Sodium: 137 mmol/L (ref 135–145)
Total Bilirubin: 1.1 mg/dL (ref 0.3–1.2)
Total Protein: 8.3 g/dL — ABNORMAL HIGH (ref 6.5–8.1)

## 2021-07-28 LAB — CBC
HCT: 48 % (ref 39.0–52.0)
Hemoglobin: 16.3 g/dL (ref 13.0–17.0)
MCH: 31.5 pg (ref 26.0–34.0)
MCHC: 34 g/dL (ref 30.0–36.0)
MCV: 92.8 fL (ref 80.0–100.0)
Platelets: 203 10*3/uL (ref 150–400)
RBC: 5.17 MIL/uL (ref 4.22–5.81)
RDW: 13.3 % (ref 11.5–15.5)
WBC: 4.4 10*3/uL (ref 4.0–10.5)
nRBC: 0 % (ref 0.0–0.2)

## 2021-07-28 LAB — LIPASE, BLOOD: Lipase: 31 U/L (ref 11–51)

## 2021-07-28 MED ORDER — IOHEXOL 300 MG/ML  SOLN
100.0000 mL | Freq: Once | INTRAMUSCULAR | Status: AC | PRN
  Administered 2021-07-28: 100 mL via INTRAVENOUS

## 2021-07-28 MED ORDER — DICYCLOMINE HCL 10 MG PO CAPS: 10.0000 mg | ORAL_CAPSULE | Freq: Three times a day (TID) | ORAL | 0 refills | Status: DC

## 2021-07-28 NOTE — ED Notes (Signed)
See triage note  presents with mid abd pain which started on Friday  denies any n/v/d or fever  states pain eases off when lying back

## 2021-07-28 NOTE — ED Provider Notes (Signed)
[facility] Provider Note  Patient Contact: 5:41 PM (approximate)   History   Abdominal Pain   HPI  Stuart Mclaughlin is a 36 y.o. male who presents the emergency department complaining of sharp lower abdomen/right lower quadrant abdominal pain.  Patient states that 3 days ago he came home from work, lay down to take a nap.  He states that he had some lower abdominal/right lower quadrant abdominal pain that developed.  He said no nausea, vomiting, diarrhea, urinary changes.  Patient with no fever.  He has had significantly decreased appetite.  Still has all of his organs to include gallbladder and appendix.     Physical Exam   Triage Vital Signs: ED Triage Vitals  Enc Vitals Group     BP 07/28/21 1437 (!) 146/111     Pulse Rate 07/28/21 1437 66     Resp 07/28/21 1437 20     Temp 07/28/21 1437 97.7 F (36.5 C)     Temp Source 07/28/21 1437 Oral     SpO2 07/28/21 1437 97 %     Weight 07/28/21 1435 270 lb (122.5 kg)     Height 07/28/21 1435 6\' 4"  (1.93 m)     Head Circumference --      Peak Flow --      Pain Score 07/28/21 1435 7     Pain Loc --      Pain Edu? --      Excl. in GC? --     Most recent vital signs: Vitals:   07/28/21 1437 07/28/21 1731  BP: (!) 146/111 (!) 140/99  Pulse: 66 68  Resp: 20 20  Temp: 97.7 F (36.5 C)   SpO2: 97% 98%     General: Alert and in no acute distress.  Cardiovascular:  Good peripheral perfusion Respiratory: Normal respiratory effort without tachypnea or retractions. Lungs CTAB.  Gastrointestinal: Bowel sounds 4 quadrants.  Soft to palpation generally of the abdomen.  Patient reports some mild tenderness diffusely across the lower quadrants but worse on the right than left as well as some right upper quadrant tenderness.  No guarding or rigidity. No palpable masses. No distention. No CVA tenderness. Musculoskeletal: Full range of motion to all extremities.  Neurologic:  No gross focal neurologic  deficits are appreciated.  Skin:   No rash noted Other:   ED Results / Procedures / Treatments   Labs (all labs ordered are listed, but only abnormal results are displayed) Labs Reviewed  COMPREHENSIVE METABOLIC PANEL - Abnormal; Notable for the following components:      Result Value   Potassium 3.4 (*)    Glucose, Bld 208 (*)    Creatinine, Ser 1.37 (*)    Total Protein 8.3 (*)    AST 46 (*)    ALT 73 (*)    All other components within normal limits  LIPASE, BLOOD  CBC  URINALYSIS, ROUTINE W REFLEX MICROSCOPIC     EKG     RADIOLOGY  I personally viewed, evaluated, and interpreted these images as part of my medical decision making, as well as reviewing the written report by the radiologist.  ED Provider Interpretation: No acute intra-abdominal findings on CT scan.  CT ABDOMEN PELVIS W CONTRAST  Result Date: 07/28/2021 CLINICAL DATA:  Right lower quadrant pain EXAM: CT ABDOMEN AND PELVIS WITH CONTRAST TECHNIQUE: Multidetector CT imaging of the abdomen and pelvis was performed using the standard protocol following bolus administration of intravenous contrast. RADIATION DOSE REDUCTION: This exam  was performed according to the departmental dose-optimization program which includes automated exposure control, adjustment of the mA and/or kV according to patient size and/or use of iterative reconstruction technique. CONTRAST:  OMNIPAQUE IOHEXOL 300 MG/ML  SOLN COMPARISON:  None Available. FINDINGS: Lower chest: No acute abnormality. Hepatobiliary: Hepatic steatosis. No calcified gallstone or biliary dilatation Pancreas: Unremarkable. No pancreatic ductal dilatation or surrounding inflammatory changes. Spleen: Normal in size without focal abnormality. Adrenals/Urinary Tract: Adrenal glands are unremarkable. Kidneys are normal, without renal calculi, focal lesion, or hydronephrosis. Bladder is unremarkable. Stomach/Bowel: Stomach is within normal limits. Appendix appears normal. No  evidence of bowel wall thickening, distention, or inflammatory changes. Vascular/Lymphatic: No significant vascular findings are present. No enlarged abdominal or pelvic lymph nodes. Reproductive: Prostate is unremarkable. Other: No abdominal wall hernia or abnormality. No abdominopelvic ascites. Musculoskeletal: Grade 1 anterolisthesis L4 on L5 with moderate degenerative changes. Irregular facet degenerative changes at L4 L5. 1 2 IMPRESSION: *No CT evidence for acute intra-abdominal or pelvic abnormality. Hepatic steatosis *Grade 1 anterolisthesis L4 on L5 with moderate degenerative changes and irregular posterior facet degenerative changes. Correlate for symptoms of back pain with follow-up MRI as indicated. Electronically Signed   By: Jasmine Pang M.D.   On: 07/28/2021 18:45    PROCEDURES:  Critical Care performed: No  Procedures   MEDICATIONS ORDERED IN ED: Medications  iohexol (OMNIPAQUE) 300 MG/ML solution 100 mL (100 mLs Intravenous Contrast Given 07/28/21 1825)     IMPRESSION / MDM / ASSESSMENT AND PLAN / ED COURSE  I reviewed the triage vital signs and the nursing notes.                              Differential diagnosis includes, but is not limited to, gastritis, abdominal cramping, UTI, nephrolithiasis, STD, appendicitis, colitis  Patient's presentation is most consistent with acute illness / injury with system symptoms.   Patient's diagnosis is consistent with generalized abdominal pain.  Patient presents emergency department complaining of lower and right-sided abdominal pain.  Patient had no fevers, chills, emesis, diarrhea or constipation.  No urinary changes.  Patient still and all of his abdominal organs.  Diffuse tenderness along the lower abdomen and right side of the abdomen without point specific tenderness.  Patient's labs are reassuring.  Imaging reveals no acute intra-abdominal findings.  Patient will be prescribed Bentyl for symptom relief.  Follow-up with primary  care as needed..  Patient is given ED precautions to return to the ED for any worsening or new symptoms.        FINAL CLINICAL IMPRESSION(S) / ED DIAGNOSES   Final diagnoses:  Generalized abdominal pain     Rx / DC Orders   ED Discharge Orders          Ordered    dicyclomine (BENTYL) 10 MG capsule  3 times daily before meals & bedtime        07/28/21 1923             Note:  This document was prepared using Dragon voice recognition software and may include unintentional dictation errors.   Lanette Hampshire 07/28/21 Tyna Jaksch, MD 07/28/21 2252

## 2021-07-28 NOTE — ED Triage Notes (Signed)
Pt via POV from home. Pt c/o generalized abd pain since Friday. Denies any NVD. Denies fever. Denies urinary symptoms. Denies abd surgeries. Pt is A&OX4 and NAD

## 2022-03-31 ENCOUNTER — Observation Stay
Admission: EM | Admit: 2022-03-31 | Discharge: 2022-04-02 | Disposition: A | Discharge status: Home / Self Care | Payer: BC Managed Care – PPO | Attending: Internal Medicine | Admitting: Internal Medicine

## 2022-03-31 DIAGNOSIS — Z86718 Personal history of other venous thrombosis and embolism: Secondary | ICD-10-CM | POA: Diagnosis not present

## 2022-03-31 DIAGNOSIS — Z79899 Other long term (current) drug therapy: Secondary | ICD-10-CM | POA: Insufficient documentation

## 2022-03-31 DIAGNOSIS — E1100 Type 2 diabetes mellitus with hyperosmolarity without nonketotic hyperglycemic-hyperosmolar coma (NKHHC): Principal | ICD-10-CM | POA: Insufficient documentation

## 2022-03-31 DIAGNOSIS — E876 Hypokalemia: Secondary | ICD-10-CM | POA: Diagnosis not present

## 2022-03-31 DIAGNOSIS — N179 Acute kidney failure, unspecified: Secondary | ICD-10-CM

## 2022-03-31 DIAGNOSIS — H538 Other visual disturbances: Secondary | ICD-10-CM | POA: Diagnosis not present

## 2022-03-31 DIAGNOSIS — E871 Hypo-osmolality and hyponatremia: Secondary | ICD-10-CM | POA: Diagnosis not present

## 2022-03-31 DIAGNOSIS — Z87891 Personal history of nicotine dependence: Secondary | ICD-10-CM | POA: Insufficient documentation

## 2022-03-31 DIAGNOSIS — E119 Type 2 diabetes mellitus without complications: Secondary | ICD-10-CM

## 2022-03-31 DIAGNOSIS — Z6839 Body mass index (BMI) 39.0-39.9, adult: Secondary | ICD-10-CM | POA: Diagnosis not present

## 2022-03-31 DIAGNOSIS — R7989 Other specified abnormal findings of blood chemistry: Secondary | ICD-10-CM

## 2022-03-31 DIAGNOSIS — I10 Essential (primary) hypertension: Secondary | ICD-10-CM | POA: Diagnosis not present

## 2022-03-31 DIAGNOSIS — E1165 Type 2 diabetes mellitus with hyperglycemia: Secondary | ICD-10-CM | POA: Diagnosis not present

## 2022-03-31 DIAGNOSIS — E1159 Type 2 diabetes mellitus with other circulatory complications: Secondary | ICD-10-CM

## 2022-03-31 DIAGNOSIS — I152 Hypertension secondary to endocrine disorders: Secondary | ICD-10-CM

## 2022-03-31 DIAGNOSIS — R739 Hyperglycemia, unspecified: Principal | ICD-10-CM

## 2022-03-31 DIAGNOSIS — E669 Obesity, unspecified: Secondary | ICD-10-CM

## 2022-03-31 LAB — CBG MONITORING, ED
Glucose-Capillary: >600 mg/dL (ref 70–99)
Glucose-Capillary: >600 mg/dL (ref 70–99)

## 2022-03-31 LAB — BLOOD GAS, VENOUS
Acid-base deficit: 1.2 mmol/L (ref 0.0–2.0)
Bicarbonate: 23.6 mmol/L (ref 20.0–28.0)
O2 Saturation: 94 %
Patient temperature: 37
pCO2, Ven: 39 mmHg — ABNORMAL LOW (ref 44–60)
pH, Ven: 7.39 (ref 7.25–7.43)
pO2, Ven: 68 mmHg — ABNORMAL HIGH (ref 32–45)

## 2022-03-31 LAB — COMPREHENSIVE METABOLIC PANEL
ALT: 49 U/L — ABNORMAL HIGH (ref 0–44)
AST: 38 U/L (ref 15–41)
Albumin: 3.8 g/dL (ref 3.5–5.0)
Alkaline Phosphatase: 98 U/L (ref 38–126)
Anion gap: 16 — ABNORMAL HIGH (ref 5–15)
BUN: 20 mg/dL (ref 6–20)
CO2: 19 mmol/L — ABNORMAL LOW (ref 22–32)
Calcium: 9.4 mg/dL (ref 8.9–10.3)
Chloride: 95 mmol/L — ABNORMAL LOW (ref 98–111)
Creatinine, Ser: 1.39 mg/dL — ABNORMAL HIGH (ref 0.61–1.24)
GFR, Estimated: >60 mL/min (ref >60)
Glucose, Bld: 801 mg/dL (ref 70–99)
Potassium: 4.8 mmol/L (ref 3.5–5.1)
Sodium: 130 mmol/L — ABNORMAL LOW (ref 135–145)
Total Bilirubin: 1.2 mg/dL (ref 0.3–1.2)
Total Protein: 7.4 g/dL (ref 6.5–8.1)

## 2022-03-31 LAB — CBC
HCT: 44.5 % (ref 39.0–52.0)
Hemoglobin: 16.3 g/dL (ref 13.0–17.0)
MCH: 32.6 pg (ref 26.0–34.0)
MCHC: 36.6 g/dL — ABNORMAL HIGH (ref 30.0–36.0)
MCV: 89 fL (ref 80.0–100.0)
Platelets: 172 10*3/uL (ref 150–400)
RBC: 5 MIL/uL (ref 4.22–5.81)
RDW: 12.1 % (ref 11.5–15.5)
WBC: 5.5 10*3/uL (ref 4.0–10.5)
nRBC: 0 % (ref 0.0–0.2)

## 2022-03-31 LAB — BETA-HYDROXYBUTYRIC ACID: Beta-Hydroxybutyric Acid: 0.33 mmol/L — ABNORMAL HIGH (ref 0.05–0.27)

## 2022-03-31 MED ORDER — INSULIN ASPART 100 UNIT/ML IJ SOLN
8.0000 [IU] | Freq: Once | INTRAMUSCULAR | Status: AC
  Administered 2022-03-31: 8 [IU] via INTRAVENOUS
  Filled 2022-03-31: qty 1

## 2022-03-31 MED ORDER — SODIUM CHLORIDE 0.9 % IV BOLUS
1000.0000 mL | Freq: Once | INTRAVENOUS | Status: AC
  Administered 2022-03-31: 1000 mL via INTRAVENOUS

## 2022-03-31 MED ORDER — SODIUM CHLORIDE 0.9 % IV SOLN: Freq: Once | INTRAVENOUS | Status: AC

## 2022-03-31 MED ORDER — LACTATED RINGERS IV SOLN: INTRAVENOUS | Status: DC

## 2022-03-31 MED ORDER — ENOXAPARIN SODIUM 60 MG/0.6ML IJ SOSY
0.5000 mg/kg | PREFILLED_SYRINGE | INTRAMUSCULAR | Status: DC
  Administered 2022-04-01 – 2022-04-02 (×2): 60 mg via SUBCUTANEOUS
  Filled 2022-03-31 (×2): qty 0.6

## 2022-03-31 MED ORDER — DEXTROSE 50 % IV SOLN: 0.0000 mL | INTRAVENOUS | Status: DC | PRN

## 2022-03-31 MED ORDER — POTASSIUM CHLORIDE 10 MEQ/100ML IV SOLN
10.0000 meq | INTRAVENOUS | Status: AC
  Administered 2022-04-01 (×2): 10 meq via INTRAVENOUS
  Filled 2022-03-31 (×2): qty 100

## 2022-03-31 MED ORDER — DEXTROSE IN LACTATED RINGERS 5 % IV SOLN: INTRAVENOUS | Status: DC

## 2022-03-31 MED ORDER — AMLODIPINE BESYLATE 5 MG PO TABS
5.0000 mg | ORAL_TABLET | Freq: Every day | ORAL | Status: DC
  Administered 2022-04-01 – 2022-04-02 (×2): 5 mg via ORAL
  Filled 2022-03-31 (×2): qty 1

## 2022-03-31 MED ORDER — INSULIN REGULAR(HUMAN) IN NACL 100-0.9 UT/100ML-% IV SOLN
INTRAVENOUS | Status: DC
  Administered 2022-04-01: 4.6 [IU]/h via INTRAVENOUS
  Administered 2022-04-01: 15 [IU]/h via INTRAVENOUS
  Filled 2022-03-31 (×2): qty 100

## 2022-03-31 NOTE — Assessment & Plan Note (Signed)
Sugar of 801 on presentation.  Patient started on insulin drip.  Should be able to come off insulin drip at 5 PM.  Given Semglee insulin 25 units at 3 PM.  Need to teach him how to inject insulin.  He is a new diabetic.  Diet exercise and weight loss discussed by me.

## 2022-03-31 NOTE — Assessment & Plan Note (Signed)
Creatinine 1.39, presumed acute.  Was 1.37 back in June but previously 1.16 Monitor for improvement with hydration

## 2022-03-31 NOTE — ED Triage Notes (Signed)
Pt sts that he has been losing weight however pt sts that his vision has been blurry and having a lot to drink. Pt sts that he just checked his BG even though he is not a diabetic and the meter said " high"

## 2022-03-31 NOTE — H&P (Signed)
History and Physical    Patient: Stuart Mclaughlin [id number] DOB: January 15, 1986 DOA: 03/31/2022 DOS: the patient was seen and examined on 03/31/2022 PCP: [name], MD  Patient coming from: Home  Chief Complaint:  Chief Complaint  Patient presents with   Hyperglycemia    HPI: Stuart Mclaughlin is a 37 y.o. male with medical history significant for Hypertension who presents to the ED with a complaint of blurred vision, polyuria and increased thirst.  He has had a 20 pound unintentional weight loss over the past month and has been feeling very weak.  He checked his blood sugar and the reading was high.  He has no prior history of diabetes.  He denies any recent illness apart from the above.  He denies nausea, vomiting, abdominal pain, diarrhea.  Denies cough, shortness of breath or chest pain. ED course and data review: On arrival BP 153/110 with otherwise normal vitals.  Labs significant for blood glucose 801 with a bicarb of 19 and anion gap of 16.  Creatinine 0000000, baseline uncertain.  Beta hydroxybutyric acid 0.33, venous pH 7.39.  CBC unremarkable. Patient was given a 2 L LR bolus and 8 units IV insulin and hospitalist consulted for admission.   Review of Systems: As mentioned in the history of present illness. All other systems reviewed and are negative.  Past Medical History:  Diagnosis Date   Allergy    Arthritis    Deep vein thrombosis (DVT) (HCC)    Hypertension    Past Surgical History:  Procedure Laterality Date   PERIPHERAL VASCULAR CATHETERIZATION  04/01/2015   Procedure: Lower Extremity Venography;  Surgeon: Algernon Huxley, MD;  Location: [facility];  Service: Cardiovascular;;   PERIPHERAL VASCULAR CATHETERIZATION Right 04/01/2015   Procedure: Lower Extremity Intervention;  Surgeon: Algernon Huxley, MD;  Location: [facility];  Service: Cardiovascular;  Laterality: Right;   PERIPHERAL VASCULAR CATHETERIZATION N/A 04/01/2015   Procedure: IVC Filter  Insertion;  Surgeon: Algernon Huxley, MD;  Location: [facility];  Service: Cardiovascular;  Laterality: N/A;   Social History:  reports that he has quit smoking. His smoking use included cigarettes. He has never used smokeless tobacco. He reports current alcohol use of about 2.0 standard drinks of alcohol per week. He reports that he does not use drugs.  No Known Allergies  No family history on file.  Prior to Admission medications   Medication Sig Start Date End Date Taking? Authorizing Provider  amLODipine (NORVASC) 5 MG tablet TAKE 1 TABLET BY MOUTH EVERY DAY 06/26/19   [name], MD  dicyclomine (BENTYL) 10 MG capsule Take 1 capsule (10 mg total) by mouth 4 (four) times daily -  before meals and at bedtime. 07/28/21   Darletta Moll, PA-C    Physical Exam: Vitals:   03/31/22 2101 03/31/22 2121 03/31/22 2130 03/31/22 2145  BP:  127/82 130/79   Pulse:  80 84 80  Resp:  18  15  Temp:      TempSrc:      SpO2:  94% 96% 96%  Weight: 117.9 kg      Physical Exam Vitals and nursing note reviewed.  Constitutional:      General: He is not in acute distress. HENT:     Head: Normocephalic and atraumatic.  Cardiovascular:     Rate and Rhythm: Normal rate and regular rhythm.     Heart sounds: Normal heart sounds.  Pulmonary:     Effort:  Pulmonary effort is normal.     Breath sounds: Normal breath sounds.  Abdominal:     Palpations: Abdomen is soft.     Tenderness: There is no abdominal tenderness.  Neurological:     Mental Status: Mental status is at baseline.     Labs on Admission: I have personally reviewed following labs and imaging studies  CBC: Recent Labs  Lab 03/31/22 2104  WBC 5.5  HGB 16.3  HCT 44.5  MCV 89.0  PLT [id number]   Basic Metabolic Panel: Recent Labs  Lab 03/31/22 2104  NA 130*  K 4.8  CL 95*  CO2 19*  GLUCOSE 801*  BUN 20  CREATININE 1.39*  CALCIUM 9.4   GFR: CrCl cannot be calculated (Unknown ideal weight.). Liver  Function Tests: Recent Labs  Lab 03/31/22 2104  AST 38  ALT 49*  ALKPHOS 98  BILITOT 1.2  PROT 7.4  ALBUMIN 3.8   No results for input(s): "LIPASE", "AMYLASE" in the last 168 hours. No results for input(s): "AMMONIA" in the last 168 hours. Coagulation Profile: No results for input(s): "INR", "PROTIME" in the last 168 hours. Cardiac Enzymes: No results for input(s): "CKTOTAL", "CKMB", "CKMBINDEX", "TROPONINI" in the last 168 hours. BNP (last 3 results) No results for input(s): "PROBNP" in the last 8760 hours. HbA1C: No results for input(s): "HGBA1C" in the last 72 hours. CBG: Recent Labs  Lab 03/31/22 2102 03/31/22 2312  GLUCAP >600* >600*   Lipid Profile: No results for input(s): "CHOL", "HDL", "LDLCALC", "TRIG", "CHOLHDL", "LDLDIRECT" in the last 72 hours. Thyroid Function Tests: No results for input(s): "TSH", "T4TOTAL", "FREET4", "T3FREE", "THYROIDAB" in the last 72 hours. Anemia Panel: No results for input(s): "VITAMINB12", "FOLATE", "FERRITIN", "TIBC", "IRON", "RETICCTPCT" in the last 72 hours. Urine analysis:    Component Value Date/Time   APPEARANCEUR Turbid (A) 01/09/2019 1417   GLUCOSEU Negative 01/09/2019 1417   BILIRUBINUR negative 03/29/2019 1658   BILIRUBINUR Negative 01/09/2019 1417   PROTEINUR Negative 03/29/2019 1658   PROTEINUR 1+ (A) 01/09/2019 1417   UROBILINOGEN 0.2 03/29/2019 1658   NITRITE negative 03/29/2019 1658   NITRITE Negative 01/09/2019 1417   LEUKOCYTESUR Negative 03/29/2019 1658   LEUKOCYTESUR 2+ (A) 01/09/2019 1417    Radiological Exams on Admission: No results found.   Data Reviewed: Relevant notes from primary care and specialist visits, past discharge summaries as available in EHR, including Care Everywhere. Prior diagnostic testing as pertinent to current admission diagnoses Updated medications and problem lists for reconciliation ED course, including vitals, labs, imaging, treatment and response to treatment Triage notes,  nursing and pharmacy notes and ED provider's notes Notable results as noted in HPI   Assessment and Plan: * Hyperosmolar non-ketotic state due to new onset  type 2 diabetes mellitus ([place]) New onset diabetic with glucose 81, anion gap 16, bicarb 18, beta hydroxybutyric acid 2.33 and normal venous pH Insulin drip per Endo tool IV fluids per Endo tool Potassium replacement per Endo tool Diabetic educator and dietary consults Extensive counseling provided   HTN (hypertension) Continue home amlodipine  AKI (acute kidney injury) ([place]) Creatinine 1.39, presumed acute.  Was 1.37 back in June but previously 1.16 Monitor for improvement with hydration    DVT prophylaxis: Lovenox  Consults: none  Advance Care Planning:   Code Status: Full Code   Family Communication: significant other at bedside  Disposition Plan: Back to previous home environment  Severity of Illness: The appropriate patient status for this patient is INPATIENT. Inpatient status is judged to be reasonable  and necessary in order to provide the required intensity of service to ensure the patient's safety. The patient's presenting symptoms, physical exam findings, and initial radiographic and laboratory data in the context of their chronic comorbidities is felt to place them at high risk for further clinical deterioration. Furthermore, it is not anticipated that the patient will be medically stable for discharge from the hospital within 2 midnights of admission.   * I certify that at the point of admission it is my clinical judgment that the patient will require inpatient hospital care spanning beyond 2 midnights from the point of admission due to high intensity of service, high risk for further deterioration and high frequency of surveillance required.*  Author: Athena Masse, MD 03/31/2022 11:46 PM  For on call review [email or web address].

## 2022-03-31 NOTE — ED Provider Notes (Signed)
[facility] Provider Note    Event Date/Time   First MD Initiated Contact with Patient 03/31/22 2120     (approximate)   History   Hyperglycemia   HPI  Stuart Mclaughlin is a 37 y.o. male  here with increased thirst, urination, and fatigue. Pt reports that over the past month he has had increasing polyuria, polydipsia. He has lost >20 lb despite not changing his diet. He has felt weak and had increasing episodes of intermittent blurred vision. His significant other checked his BG today and it read HIGH so he presents for evaluation. No known personal h/o diabetes. No recent fever, chills, or infections. No recent travel.      Physical Exam   Triage Vital Signs: ED Triage Vitals  Enc Vitals Group     BP 03/31/22 2100 (!) 153/110     Pulse Rate 03/31/22 2100 82     Resp 03/31/22 2100 17     Temp 03/31/22 2100 98.1 F (36.7 C)     Temp Source 03/31/22 2100 Oral     SpO2 03/31/22 2100 94 %     Weight 03/31/22 2101 260 lb (117.9 kg)     Height --      Head Circumference --      Peak Flow --      Pain Score 03/31/22 2101 0     Pain Loc --      Pain Edu? --      Excl. in [place]? --     Most recent vital signs: Vitals:   03/31/22 2130 03/31/22 2145  BP: 130/79   Pulse: 84 80  Resp:  15  Temp:    SpO2: 96% 96%     General: Awake, no distress.  CV:  Good peripheral perfusion. RRR. Resp:  Normal effort.  Abd:  No distention. No tenderness. Other:  Mildly dry MM. White plaques noted on posterior pharynx. OP patent.   ED Results / Procedures / Treatments   Labs (all labs ordered are listed, but only abnormal results are displayed) Labs Reviewed  CBC - Abnormal; Notable for the following components:      Result Value   MCHC 36.6 (*)    All other components within normal limits  COMPREHENSIVE METABOLIC PANEL - Abnormal; Notable for the following components:   Sodium 130 (*)    Chloride 95 (*)    CO2 19 (*)    Glucose, Bld 801 (*)     Creatinine, Ser 1.39 (*)    ALT 49 (*)    Anion gap 16 (*)    All other components within normal limits  BLOOD GAS, VENOUS - Abnormal; Notable for the following components:   pCO2, Ven 39 (*)    pO2, Ven 68 (*)    All other components within normal limits  BETA-HYDROXYBUTYRIC ACID - Abnormal; Notable for the following components:   Beta-Hydroxybutyric Acid 0.33 (*)    All other components within normal limits  CBG MONITORING, ED - Abnormal; Notable for the following components:   Glucose-Capillary >600 (*)    All other components within normal limits  CBG MONITORING, ED - Abnormal; Notable for the following components:   Glucose-Capillary >600 (*)    All other components within normal limits  URINALYSIS, ROUTINE W REFLEX MICROSCOPIC  CBG MONITORING, ED  CBG MONITORING, ED     EKG    RADIOLOGY    I also independently reviewed and agree with radiologist interpretations.   PROCEDURES:  Critical  Care performed: No  .1-3 Lead EKG Interpretation  Performed by: Duffy Bruce, MD Authorized by: Duffy Bruce, MD     Interpretation: normal     ECG rate:  80-90   ECG rate assessment: normal     Rhythm: sinus rhythm     Ectopy: none     Conduction: normal   Comments:     Indication: hyperglycemia, weakness     MEDICATIONS ORDERED IN ED: Medications  0.9 %  sodium chloride infusion (has no administration in time range)  sodium chloride 0.9 % bolus 1,000 mL (1,000 mLs Intravenous New Bag/Given 03/31/22 2153)  sodium chloride 0.9 % bolus 1,000 mL (1,000 mLs Intravenous New Bag/Given 03/31/22 2153)  insulin aspart (novoLOG) injection 8 Units (8 Units Intravenous Given 03/31/22 2148)     IMPRESSION / MDM / ASSESSMENT AND PLAN / ED COURSE  I reviewed the triage vital signs and the nursing notes.                              Differential diagnosis includes, but is not limited to, new onset DM, other metabolic derangement, hyper/hypothyroidism, AKI.  Patient's  presentation is most consistent with acute presentation with potential threat to life or bodily function.  The patient is on the cardiac monitor to evaluate for evidence of arrhythmia and/or significant heart rate changes.  37 yo M here with new onset hyperglycemia, likely type 2 diabetes. CMP is borderline with co2 19, glu >800 though pH is normal. BHB 0.33, just above normal. Will give ivf, insulin and admit. Pt is HDS, mentating well in no distress.     FINAL CLINICAL IMPRESSION(S) / ED DIAGNOSES   Final diagnoses:  Hyperglycemia  New onset type 2 diabetes mellitus ([place])     Rx / DC Orders   ED Discharge Orders     None        Note:  This document was prepared using Dragon voice recognition software and may include unintentional dictation errors.   Duffy Bruce, MD 03/31/22 [phone]

## 2022-03-31 NOTE — Assessment & Plan Note (Signed)
-  Continue home amlodipine 

## 2022-04-01 ENCOUNTER — Other Ambulatory Visit (HOSPITAL_COMMUNITY): Payer: Self-pay

## 2022-04-01 ENCOUNTER — Telehealth (HOSPITAL_COMMUNITY): Payer: Self-pay

## 2022-04-01 ENCOUNTER — Encounter: Payer: Self-pay | Admitting: Internal Medicine

## 2022-04-01 ENCOUNTER — Other Ambulatory Visit: Payer: Self-pay

## 2022-04-01 DIAGNOSIS — N179 Acute kidney failure, unspecified: Secondary | ICD-10-CM

## 2022-04-01 DIAGNOSIS — R7989 Other specified abnormal findings of blood chemistry: Secondary | ICD-10-CM | POA: Diagnosis not present

## 2022-04-01 DIAGNOSIS — E876 Hypokalemia: Secondary | ICD-10-CM | POA: Diagnosis not present

## 2022-04-01 DIAGNOSIS — E669 Obesity, unspecified: Secondary | ICD-10-CM

## 2022-04-01 DIAGNOSIS — E1100 Type 2 diabetes mellitus with hyperosmolarity without nonketotic hyperglycemic-hyperosmolar coma (NKHHC): Secondary | ICD-10-CM | POA: Diagnosis not present

## 2022-04-01 DIAGNOSIS — I10 Essential (primary) hypertension: Secondary | ICD-10-CM

## 2022-04-01 LAB — URINALYSIS, ROUTINE W REFLEX MICROSCOPIC
Bacteria, UA: NONE SEEN
Bilirubin Urine: NEGATIVE
Glucose, UA: >=500 mg/dL — AB
Hgb urine dipstick: NEGATIVE
Ketones, ur: NEGATIVE mg/dL
Leukocytes,Ua: NEGATIVE
Nitrite: NEGATIVE
Protein, ur: NEGATIVE mg/dL
Specific Gravity, Urine: 1.027 (ref 1.005–1.030)
Squamous Epithelial / HPF: NONE SEEN /HPF (ref 0–5)
pH: 5 (ref 5.0–8.0)

## 2022-04-01 LAB — BASIC METABOLIC PANEL
Anion gap: 13 (ref 5–15)
Anion gap: 11 (ref 5–15)
Anion gap: 8 (ref 5–15)
Anion gap: 9 (ref 5–15)
Anion gap: 7 (ref 5–15)
BUN: 16 mg/dL (ref 6–20)
BUN: 15 mg/dL (ref 6–20)
BUN: 14 mg/dL (ref 6–20)
BUN: 13 mg/dL (ref 6–20)
BUN: 11 mg/dL (ref 6–20)
CO2: 20 mmol/L — ABNORMAL LOW (ref 22–32)
CO2: 23 mmol/L (ref 22–32)
CO2: 24 mmol/L (ref 22–32)
CO2: 26 mmol/L (ref 22–32)
CO2: 26 mmol/L (ref 22–32)
Calcium: 9.4 mg/dL (ref 8.9–10.3)
Calcium: 9.2 mg/dL (ref 8.9–10.3)
Calcium: 8.8 mg/dL — ABNORMAL LOW (ref 8.9–10.3)
Calcium: 8.8 mg/dL — ABNORMAL LOW (ref 8.9–10.3)
Calcium: 8.9 mg/dL (ref 8.9–10.3)
Chloride: 104 mmol/L (ref 98–111)
Chloride: 102 mmol/L (ref 98–111)
Chloride: 106 mmol/L (ref 98–111)
Chloride: 105 mmol/L (ref 98–111)
Chloride: 106 mmol/L (ref 98–111)
Creatinine, Ser: 1.2 mg/dL (ref 0.61–1.24)
Creatinine, Ser: 1.01 mg/dL (ref 0.61–1.24)
Creatinine, Ser: 0.85 mg/dL (ref 0.61–1.24)
Creatinine, Ser: 1 mg/dL (ref 0.61–1.24)
Creatinine, Ser: 0.96 mg/dL (ref 0.61–1.24)
GFR, Estimated: >60 mL/min (ref >60)
GFR, Estimated: >60 mL/min (ref >60)
GFR, Estimated: >60 mL/min (ref >60)
GFR, Estimated: >60 mL/min (ref >60)
GFR, Estimated: >60 mL/min (ref >60)
Glucose, Bld: 337 mg/dL — ABNORMAL HIGH (ref 70–99)
Glucose, Bld: 219 mg/dL — ABNORMAL HIGH (ref 70–99)
Glucose, Bld: 192 mg/dL — ABNORMAL HIGH (ref 70–99)
Glucose, Bld: 186 mg/dL — ABNORMAL HIGH (ref 70–99)
Glucose, Bld: 150 mg/dL — ABNORMAL HIGH (ref 70–99)
Potassium: 3.6 mmol/L (ref 3.5–5.1)
Potassium: 3.6 mmol/L (ref 3.5–5.1)
Potassium: 3.4 mmol/L — ABNORMAL LOW (ref 3.5–5.1)
Potassium: 3.4 mmol/L — ABNORMAL LOW (ref 3.5–5.1)
Potassium: 3.8 mmol/L (ref 3.5–5.1)
Sodium: 137 mmol/L (ref 135–145)
Sodium: 136 mmol/L (ref 135–145)
Sodium: 138 mmol/L (ref 135–145)
Sodium: 140 mmol/L (ref 135–145)
Sodium: 139 mmol/L (ref 135–145)

## 2022-04-01 LAB — GLUCOSE, CAPILLARY
Glucose-Capillary: 467 mg/dL — ABNORMAL HIGH (ref 70–99)
Glucose-Capillary: 285 mg/dL — ABNORMAL HIGH (ref 70–99)
Glucose-Capillary: 231 mg/dL — ABNORMAL HIGH (ref 70–99)
Glucose-Capillary: 249 mg/dL — ABNORMAL HIGH (ref 70–99)
Glucose-Capillary: 177 mg/dL — ABNORMAL HIGH (ref 70–99)
Glucose-Capillary: 190 mg/dL — ABNORMAL HIGH (ref 70–99)
Glucose-Capillary: 194 mg/dL — ABNORMAL HIGH (ref 70–99)
Glucose-Capillary: 200 mg/dL — ABNORMAL HIGH (ref 70–99)
Glucose-Capillary: 197 mg/dL — ABNORMAL HIGH (ref 70–99)
Glucose-Capillary: 194 mg/dL — ABNORMAL HIGH (ref 70–99)
Glucose-Capillary: 177 mg/dL — ABNORMAL HIGH (ref 70–99)
Glucose-Capillary: 199 mg/dL — ABNORMAL HIGH (ref 70–99)
Glucose-Capillary: 173 mg/dL — ABNORMAL HIGH (ref 70–99)
Glucose-Capillary: 151 mg/dL — ABNORMAL HIGH (ref 70–99)
Glucose-Capillary: 158 mg/dL — ABNORMAL HIGH (ref 70–99)
Glucose-Capillary: 179 mg/dL — ABNORMAL HIGH (ref 70–99)
Glucose-Capillary: 331 mg/dL — ABNORMAL HIGH (ref 70–99)

## 2022-04-01 LAB — HIV ANTIBODY (ROUTINE TESTING W REFLEX): HIV Screen 4th Generation wRfx: NONREACTIVE

## 2022-04-01 LAB — HEMOGLOBIN A1C
Hgb A1c MFr Bld: 13.2 % — ABNORMAL HIGH (ref 4.8–5.6)
Mean Plasma Glucose: 332.14 mg/dL

## 2022-04-01 LAB — CBG MONITORING, ED
Glucose-Capillary: 574 mg/dL (ref 70–99)
Glucose-Capillary: 510 mg/dL (ref 70–99)

## 2022-04-01 LAB — MRSA NEXT GEN BY PCR, NASAL: MRSA by PCR Next Gen: NOT DETECTED

## 2022-04-01 MED ORDER — INSULIN ASPART 100 UNIT/ML IJ SOLN
0.0000 [IU] | Freq: Every day | INTRAMUSCULAR | Status: DC
  Administered 2022-04-01: 4 [IU] via SUBCUTANEOUS
  Filled 2022-04-01: qty 1

## 2022-04-01 MED ORDER — POTASSIUM CHLORIDE CRYS ER 20 MEQ PO TBCR
40.0000 meq | EXTENDED_RELEASE_TABLET | Freq: Once | ORAL | Status: AC
  Administered 2022-04-01: 40 meq via ORAL
  Filled 2022-04-01: qty 2

## 2022-04-01 MED ORDER — INSULIN GLARGINE-YFGN 100 UNIT/ML ~~LOC~~ SOLN
25.0000 [IU] | Freq: Every day | SUBCUTANEOUS | Status: DC
  Administered 2022-04-01 – 2022-04-02 (×2): 25 [IU] via SUBCUTANEOUS
  Filled 2022-04-01 (×2): qty 0.25

## 2022-04-01 MED ORDER — ORAL CARE MOUTH RINSE: 15.0000 mL | OROMUCOSAL | Status: DC | PRN

## 2022-04-01 MED ORDER — LIVING WELL WITH DIABETES BOOK
Freq: Once | Status: AC
  Filled 2022-04-01: qty 1

## 2022-04-01 MED ORDER — INSULIN ASPART 100 UNIT/ML IJ SOLN: 0.0000 [IU] | Freq: Three times a day (TID) | INTRAMUSCULAR | Status: DC

## 2022-04-01 MED ORDER — CHLORHEXIDINE GLUCONATE CLOTH 2 % EX PADS
6.0000 | MEDICATED_PAD | Freq: Every day | CUTANEOUS | Status: DC
  Administered 2022-04-01: 6 via TOPICAL

## 2022-04-01 MED ORDER — INSULIN STARTER KIT- PEN NEEDLES (ENGLISH)
1.0000 | Freq: Once | Status: AC
  Administered 2022-04-01: 1
  Filled 2022-04-01: qty 1

## 2022-04-01 MED ORDER — INSULIN ASPART 100 UNIT/ML IJ SOLN
0.0000 [IU] | Freq: Three times a day (TID) | INTRAMUSCULAR | Status: DC
  Administered 2022-04-01: 2 [IU] via SUBCUTANEOUS
  Administered 2022-04-02: 7 [IU] via SUBCUTANEOUS
  Filled 2022-04-01 (×2): qty 1

## 2022-04-01 NOTE — TOC Initial Note (Signed)
Transition of Care [facility]) - Initial/Assessment Note    Patient Details  Name: Stuart Mclaughlin MRN: [id number] Date of Birth: 09-30-1985  Transition of Care [facility]) CM/SW Contact:    Shelbie Hutching, RN Phone Number: 04/01/2022, 10:59 AM  Clinical Narrative:                  Transition of Care [facility]) Screening Note   Patient Details  Name: Stuart Mclaughlin Date of Birth: 08-19-1985   Transition of Care [facility] CM/SW Contact:    Shelbie Hutching, RN Phone Number: 04/01/2022, 10:59 AM    Transition of Care Department [facility]) has reviewed patient and no TOC needs have been identified at this time. We will continue to monitor patient advancement through interdisciplinary progression rounds. If new patient transition needs arise, please place a TOC consult.          Patient Goals and CMS Choice            Expected Discharge Plan and Services                                              Prior Living Arrangements/Services                       Activities of Daily Living Home Assistive Devices/Equipment: None ADL Screening (condition at time of admission) Patient's cognitive ability adequate to safely complete daily activities?: Yes Is the patient deaf or have difficulty hearing?: No Does the patient have difficulty seeing, even when wearing glasses/contacts?: No Does the patient have difficulty concentrating, remembering, or making decisions?: No Patient able to express need for assistance with ADLs?: No Does the patient have difficulty dressing or bathing?: No Independently performs ADLs?: Yes (appropriate for developmental age) Communication: Independent Dressing (OT): Independent Feeding: Independent Bathing: Independent Toileting: Independent In/Out Bed: Independent Walks in Home: Independent Does the patient have difficulty walking or climbing stairs?: No Weakness of Legs: None Weakness of Arms/Hands: None  Permission Sought/Granted                   Emotional Assessment              Admission diagnosis:  Hyperglycemia [R73.9] New onset type 2 diabetes mellitus ([place]) [E11.9] Hyperosmolar hyperglycemic state (HHS) ([place]) [E11.00] Hyperosmolar coma due to type 2 diabetes mellitus ([place]) [E11.01] Patient Active Problem List   Diagnosis Date Noted   Hyperosmolar coma due to type 2 diabetes mellitus ([place]) 04/01/2022   New onset type 2 diabetes mellitus ([place]) 03/31/2022   AKI (acute kidney injury) ([place]) 03/31/2022   Hyperosmolar non-ketotic state due to new onset  type 2 diabetes mellitus ([place]) 03/31/2022   Hyperosmolar hyperglycemic state (HHS) ([place]) 03/31/2022   HTN (hypertension) 03/31/2022   PCP:  [name], MD Pharmacy:   [id number] [place], [place] [address] [place] [address] Phone: [phone] Fax: [phone]     Social Determinants of Health (SDOH) Social History: SDOH Screenings   Food Insecurity: No Food Insecurity (04/01/2022)  Housing: Low Risk  (04/01/2022)  Transportation Needs: No Transportation Needs (04/01/2022)  Utilities: Not At Risk (04/01/2022)  Alcohol Screen: Low Risk  (03/29/2019)  Depression (PHQ2-9): Low Risk  (03/29/2019)  Tobacco Use: Medium Risk (04/01/2022)   SDOH Interventions:     Readmission Risk Interventions  No data to display

## 2022-04-01 NOTE — Telephone Encounter (Signed)
Pharmacy Patient Advocate Encounter  Insurance verification completed.    The patient is insured through [employer]   The patient is currently admitted and ran test claims for the following: Lantus, Semglee, Basaglar, Toujeo, Humalog, Novolog, Freestyle [place] 3 sensors, Dexcom [employer].  Copays and coinsurance results were relayed to Inpatient clinical team.

## 2022-04-01 NOTE — Discharge Instructions (Signed)
Carbohydrate Counting For People With Diabetes  Foods with carbohydrates make your blood glucose level go up. Learning how to count carbohydrates can help you control your blood glucose levels. First, identify the foods you eat that contain carbohydrates. Then, using the Foods with Carbohydrates chart, determine about how much carbohydrates are in your meals and snacks. Make sure you are eating foods with fiber, protein, and healthy fat along with your carbohydrate foods. Foods with Carbohydrates The following table shows carbohydrate foods that have about 15 grams of carbohydrate each. Using measuring cups, spoons, or a food scale when you first begin learning about carbohydrate counting can help you learn about the portion sizes you typically eat. The following foods have 15 grams carbohydrate each:  Grains 1 slice bread (1 ounce)  1 small tortilla (6-inch size)   large bagel (1 ounce)  1/3 cup pasta or rice (cooked)   hamburger or hot dog bun ( ounce)   cup cooked cereal   to  cup ready-to-eat cereal  2 taco shells (5-inch size) Fruit 1 small fresh fruit ( to 1 cup)   medium banana  17 small grapes (3 ounces)  1 cup melon or berries   cup canned or frozen fruit  2 tablespoons dried fruit (blueberries, cherries, cranberries, raisins)   cup unsweetened fruit juice  Starchy Vegetables  cup cooked beans, peas, corn, potatoes/sweet potatoes   large baked potato (3 ounces)  1 cup acorn or butternut squash  Snack Foods 3 to 6 crackers  8 potato chips or 13 tortilla chips ( ounce to 1 ounce)  3 cups popped popcorn  Dairy 3/4 cup (6 ounces) nonfat plain yogurt, or yogurt with sugar-free sweetener  1 cup milk  1 cup plain rice, soy, coconut or flavored almond milk Sweets and Desserts  cup ice cream or frozen yogurt  1 tablespoon jam, jelly, pancake syrup, table sugar, or honey  2 tablespoons light pancake syrup  1 inch square of frosted cake or 2 inch square of unfrosted  cake  2 small cookies (2/3 ounce each) or  large cookie  Sometimes you'll have to estimate carbohydrate amounts if you don't know the exact recipe. One cup of mixed foods like soups can have 1 to 2 carbohydrate servings, while some casseroles might have 2 or more servings of carbohydrate. Foods that have less than 20 calories in each serving can be counted as "free" foods. Count 1 cup raw vegetables, or  cup cooked non-starchy vegetables as "free" foods. If you eat 3 or more servings at one meal, then count them as 1 carbohydrate serving.  Foods without Carbohydrates  Not all foods contain carbohydrates. Meat, some dairy, fats, non-starchy vegetables, and many beverages don't contain carbohydrate. So when you count carbohydrates, you can generally exclude chicken, pork, beef, fish, seafood, eggs, tofu, cheese, butter, sour cream, avocado, nuts, seeds, olives, mayonnaise, water, black coffee, unsweetened tea, and zero-calorie drinks. Vegetables with no or low carbohydrate include green beans, cauliflower, tomatoes, and onions. How much carbohydrate should I eat at each meal?  Carbohydrate counting can help you plan your meals and manage your weight. Following are some starting points for carbohydrate intake at each meal. Work with your registered dietitian nutritionist to find the best range that works for your blood glucose and weight.   To Lose Weight To Maintain Weight  Women 2 - 3 carb servings 3 - 4 carb servings  Men 3 - 4 carb servings 4 - 5 carb servings  Checking your   blood glucose after meals will help you know if you need to adjust the timing, type, or number of carbohydrate servings in your meal plan. Achieve and keep a healthy body weight by balancing your food intake and physical activity.  Tips How should I plan my meals?  Plan for half the food on your plate to include non-starchy vegetables, like salad greens, broccoli, or carrots. Try to eat 3 to 5 servings of non-starchy vegetables  every day. Have a protein food at each meal. Protein foods include chicken, fish, meat, eggs, or beans (note that beans contain carbohydrate). These two food groups (non-starchy vegetables and proteins) are low in carbohydrate. If you fill up your plate with these foods, you will eat less carbohydrate but still fill up your stomach. Try to limit your carbohydrate portion to  of the plate.  What fats are healthiest to eat?  Diabetes increases risk for heart disease. To help protect your heart, eat more healthy fats, such as olive oil, nuts, and avocado. Eat less saturated fats like butter, cream, and high-fat meats, like bacon and sausage. Avoid trans fats, which are in all foods that list "partially hydrogenated oil" as an ingredient. What should I drink?  Choose drinks that are not sweetened with sugar. The healthiest choices are water, carbonated or seltzer waters, and tea and coffee without added sugars.  Sweet drinks will make your blood glucose go up very quickly. One serving of soda or energy drink is  cup. It is best to drink these beverages only if your blood glucose is low.  Artificially sweetened, or diet drinks, typically do not increase your blood glucose if they have zero calories in them. Read labels of beverages, as some diet drinks do have carbohydrate and will raise your blood glucose. Label Reading Tips Read Nutrition Facts labels to find out how many grams of carbohydrate are in a food you want to eat. Don't forget: sometimes serving sizes on the label aren't the same as how much food you are going to eat, so you may need to calculate how much carbohydrate is in the food you are serving yourself.   Carbohydrate Counting for People with Diabetes Sample 1-Day Menu  Breakfast  cup yogurt, low fat, low sugar (1 carbohydrate serving)   cup cereal, ready-to-eat, unsweetened (1 carbohydrate serving)  1 cup strawberries (1 carbohydrate serving)   cup almonds ( carbohydrate serving)   Lunch 1, 5 ounce can chunk light tuna  2 ounces cheese, low fat cheddar  6 whole wheat crackers (1 carbohydrate serving)  1 small apple (1 carbohydrate servings)   cup carrots ( carbohydrate serving)   cup snap peas  1 cup 1% milk (1 carbohydrate serving)   Evening Meal Stir fry made with: 3 ounces chicken  1 cup brown rice (3 carbohydrate servings)   cup broccoli ( carbohydrate serving)   cup green beans   cup onions  1 tablespoon olive oil  2 tablespoons teriyaki sauce ( carbohydrate serving)  Evening Snack 1 extra small banana (1 carbohydrate serving)  1 tablespoon peanut butter   Carbohydrate Counting for People with Diabetes Vegan Sample 1-Day Menu  Breakfast 1 cup cooked oatmeal (2 carbohydrate servings)   cup blueberries (1 carbohydrate serving)  2 tablespoons flaxseeds  1 cup soymilk fortified with calcium and vitamin D  1 cup coffee  Lunch 2 slices whole wheat bread (2 carbohydrate servings)   cup baked tofu   cup lettuce  2 slices tomato  2 slices avocado     cup baby carrots ( carbohydrate serving)  1 orange (1 carbohydrate serving)  1 cup soymilk fortified with calcium and vitamin D   Evening Meal Burrito made with: 1 6-inch corn tortilla (1 carbohydrate serving)  1 cup refried vegetarian beans (2 carbohydrate servings)   cup chopped tomatoes   cup lettuce   cup salsa  1/3 cup brown rice (1 carbohydrate serving)  1 tablespoon olive oil for rice   cup zucchini   Evening Snack 6 small whole grain crackers (1 carbohydrate serving)  2 apricots ( carbohydrate serving)   cup unsalted peanuts ( carbohydrate serving)    Carbohydrate Counting for People with Diabetes Vegetarian (Lacto-Ovo) Sample 1-Day Menu  Breakfast 1 cup cooked oatmeal (2 carbohydrate servings)   cup blueberries (1 carbohydrate serving)  2 tablespoons flaxseeds  1 egg  1 cup 1% milk (1 carbohydrate serving)  1 cup coffee  Lunch 2 slices whole wheat bread (2 carbohydrate  servings)  2 ounces low-fat cheese   cup lettuce  2 slices tomato  2 slices avocado   cup baby carrots ( carbohydrate serving)  1 orange (1 carbohydrate serving)  1 cup unsweetened tea  Evening Meal Burrito made with: 1 6-inch corn tortilla (1 carbohydrate serving)   cup refried vegetarian beans (1 carbohydrate serving)   cup tomatoes   cup lettuce   cup salsa  1/3 cup brown rice (1 carbohydrate serving)  1 tablespoon olive oil for rice   cup zucchini  1 cup 1% milk (1 carbohydrate serving)  Evening Snack 6 small whole grain crackers (1 carbohydrate serving)  2 apricots ( carbohydrate serving)   cup unsalted peanuts ( carbohydrate serving)    Copyright 2020  Academy of Nutrition and Dietetics. All rights reserved.  Using Nutrition Labels: Carbohydrate  Serving Size  Look at the serving size. All the information on the label is based on this portion. Servings Per Container  The number of servings contained in the package. Guidelines for Carbohydrate  Look at the total grams of carbohydrate in the serving size.  1 carbohydrate choice = 15 grams of carbohydrate. Range of Carbohydrate Grams Per Choice  Carbohydrate Grams/Choice Carbohydrate Choices  6-10   11-20 1  21-25 1  26-35 2  36-40 2  41-50 3  51-55 3  56-65 4  66-70 4  71-80 5    Copyright 2020  Academy of Nutrition and Dietetics. All rights reserved.   Plate Method for Diabetes   Foods with carbohydrates make your blood glucose level go up. The plate method is a simple way to meal plan and control the amount of carbohydrate you eat.         Use the following guidance to build a healthy plate to control carbohydrates. Divide a 9-inch plate into 3 sections, and consider your beverage the 4th section of your meal: Food Group Examples of Foods/Beverages for This Section of your Meal  Section 1: Non-starchy vegetables Fill  of your plate to include non-starchy vegetables Asparagus, broccoli,  brussels sprouts, cabbage, carrots, cauliflower, celery, cucumber, green beans, mushrooms, peppers, salad greens, tomatoes, or zucchini.  Section 2: Protein foods Fill  of your plate to include a lean protein Lean meat, poultry, fish, seafood, cheese, eggs, lean deli meat, tofu, beans, lentils, nuts or nut butters.  Section 3: Carbohydrate foods Fill  of your plate to include carbohydrate foods Whole grains, whole wheat bread, brown rice, whole grain pasta, polenta, corn tortillas, fruit, or starchy vegetables (potatoes, green peas, corn, beans,  acorn squash, and butternut squash). One cup of milk also counts as a food that contains carbohydrate.  Section 4: Beverage Choose water or a low-calorie drink for your beverage. Unsweetened tea, coffee, or flavored/sparkling water without added sugar.  Image reprinted with permission from The American Diabetes Association.  Copyright 2022 by the American Diabetes Association.   Copyright 2022  Academy of Nutrition and Dietetics. All rights reserved

## 2022-04-01 NOTE — Telephone Encounter (Signed)
Pharmacy Patient Advocate Encounter  Prior Authorization for Dexcom G7 sensors and receiver has been approved.    KEYS: [id number] PA# [employer] Effective dates: 04/01/2022 through 03/31/2023

## 2022-04-01 NOTE — Assessment & Plan Note (Signed)
BMI 32.42 with current height and weight in computer.  Diet exercise and weight loss discussed by me.

## 2022-04-01 NOTE — Assessment & Plan Note (Signed)
Secondary to elevated sugars.  Sodium in the normal range.

## 2022-04-01 NOTE — Progress Notes (Signed)
Progress Note   Patient: Stuart Mclaughlin [id number] DOB: 1986/01/28 DOA: 03/31/2022     1 DOS: the patient was seen and examined on 04/01/2022   Brief hospital course: 37 y.o. male with medical history significant for Hypertension who presents to the ED with a complaint of blurred vision, polyuria and increased thirst.  He has had a 20 pound unintentional weight loss over the past month and has been feeling very weak.  He checked his blood sugar and the reading was high.  He has no prior history of diabetes.  He denies any recent illness apart from the above.  He denies nausea, vomiting, abdominal pain, diarrhea.  Denies cough, shortness of breath or chest pain. ED course and data review: On arrival BP 153/110 with otherwise normal vitals.  Labs significant for blood glucose 801 with a bicarb of 19 and anion gap of 16.  Creatinine 0000000, baseline uncertain.  Beta hydroxybutyric acid 0.33, venous pH 7.39.  CBC unremarkable. Patient was given a 2 L LR bolus and 8 units IV insulin and hospitalist consulted for admission  2/14.  Patient will come off insulin drip at 5 PM.  Semglee insulin 25 units daily.  Will advance diet.  Nursing staff to teach him how to inject insulin.  Diet exercise and weight loss discussed at the bedside by me earlier today.  Assessment and Plan: * Hyperosmolar non-ketotic state due to new onset  type 2 diabetes mellitus ([place]) Sugar of 801 on presentation.  Patient started on insulin drip.  Should be able to come off insulin drip at 5 PM.  Given Semglee insulin 25 units at 3 PM.  Need to teach him how to inject insulin.  He is a new diabetic.  Diet exercise and weight loss discussed by me.  AKI (acute kidney injury) ([place]) Creatinine 1.39 on presentation down to 1.0.  Pseudohyponatremia Secondary to elevated sugars.  Sodium in the normal range.  Hypokalemia Replace potassium orally  HTN (hypertension) Continue home amlodipine  Obesity (BMI 30-39.9) BMI 32.42 with  current height and weight in computer.  Diet exercise and weight loss discussed by me.        Subjective: Patient was a little sleepy this morning.  Talked him about diet exercise and weight loss for new onset diabetes.  Explained that he will go home on insulin and need to know how to inject insulin.  Feeling a little bit better than when he came in.  Admitted with hyperosmolar coma.  Physical Exam: Vitals:   04/01/22 1400 04/01/22 1500 04/01/22 1510 04/01/22 1600  BP: (!) 150/98  136/86 109/85  Pulse: 63 70 71 75  Resp: 13 12 12 13  $ Temp:      TempSrc:      SpO2: 95% 94% 99% 96%  Weight:      Height:       Physical Exam HENT:     Head: Normocephalic.     Mouth/Throat:     Pharynx: No oropharyngeal exudate.  Eyes:     General: Lids are normal.     Conjunctiva/sclera: Conjunctivae normal.  Cardiovascular:     Rate and Rhythm: Normal rate and regular rhythm.     Heart sounds: Normal heart sounds, S1 normal and S2 normal.  Pulmonary:     Breath sounds: Normal breath sounds. No decreased breath sounds, wheezing, rhonchi or rales.  Abdominal:     Palpations: Abdomen is soft.     Tenderness: There is no abdominal tenderness.  Musculoskeletal:  Right lower leg: No swelling.     Left lower leg: No swelling.  Skin:    General: Skin is warm.     Findings: No rash.  Neurological:     Mental Status: He is alert.     Data Reviewed: Hemoglobin A1c 13.2 sodium 140, potassium 3.4, creatinine 1.0, white blood cell count 5.5, hemoglobin 16.3, platelet count 172  Family Communication: Updated patient's mother on the phone  Disposition: Status is: Observation Likely will come off insulin drip at 5 PM.  Will need to teach him how to inject insulin prior to disposition.  Likely home tomorrow.  Planned Discharge Destination: Home    Time spent: 28 minutes  Author: Loletha Grayer, MD 04/01/2022 4:16 PM  For on call review [email or web address].

## 2022-04-01 NOTE — Progress Notes (Signed)
Anticoagulation monitoring(Lovenox):  37 yo male ordered Lovenox 40 mg Q24h    Filed Weights   03/31/22 2101  Weight: 117.9 kg (260 lb)   BMI 30.8   Lab Results  Component Value Date   CREATININE 1.39 (H) 03/31/2022   CREATININE 1.37 (H) 07/28/2021   CREATININE 1.16 09/11/2019   Estimated Creatinine Clearance: 103.5 mL/min (A) (by C-G formula based on SCr of 1.39 mg/dL (H)). Hemoglobin & Hematocrit     Component Value Date/Time   HGB 16.3 03/31/2022 2104   HGB 16.2 03/30/2019 0901   HCT 44.5 03/31/2022 2104   HCT 46.7 03/30/2019 0901     Per Protocol for Patient with estCrcl > 30 ml/min and BMI > 30, will transition to Lovenox 60 mg Q24h.

## 2022-04-01 NOTE — Hospital Course (Signed)
37 y.o. male with medical history significant for Hypertension who presents to the ED with a complaint of blurred vision, polyuria and increased thirst.  He has had a 20 pound unintentional weight loss over the past month and has been feeling very weak.  He checked his blood sugar and the reading was high.  He has no prior history of diabetes.  He denies any recent illness apart from the above.  He denies nausea, vomiting, abdominal pain, diarrhea.  Denies cough, shortness of breath or chest pain. ED course and data review: On arrival BP 153/110 with otherwise normal vitals.  Labs significant for blood glucose 801 with a bicarb of 19 and anion gap of 16.  Creatinine 0000000, baseline uncertain.  Beta hydroxybutyric acid 0.33, venous pH 7.39.  CBC unremarkable. Patient was given a 2 L LR bolus and 8 units IV insulin and hospitalist consulted for admission  2/14.  Patient will come off insulin drip at 5 PM.  Semglee insulin 25 units daily.  Will advance diet.  Nursing staff to teach him how to inject insulin.  Diet exercise and weight loss discussed at the bedside by me earlier today. 2/15.  Increase long-acting insulin to 30 units daily.  Will give 8 units short acting insulin prior to meals.  Diet exercise and weight loss discussed.  Follow-up with PCP.

## 2022-04-01 NOTE — Inpatient Diabetes Management (Addendum)
For Discharge the following insulins will be most affordable: Lantus Insulin pen- Order # [id number] Humalog Insulin pen- Order # [id number]  Also Needs:  Insulin Pen Needles- Order # [id number] Traditional CBG meter- Order # [id number] (letter O precedes the number) Dexcom CGM- Order # [id number]  Met w/ pt at bedside.  He was sleepy and had a little bit of a hard time keeping his eyes open but we were able to have a short conversation about his new diagnosis of Diabetes.  Will request the Diabetes RN to make return visit 02/15.  Pt told me he works for [employer] in [employer].  Has an aunt with diabetes.  No other family members that he knows of.  Lives with Girlfriend.  Checked his CBG at home with his GF's meter and the meter said High >600.    In brief we talked about:  New diagnosis.  Discussed A1C results with him and explained what an A1C is, basic pathophysiology of DM Type 2, basic home care, basic diabetes diet nutrition principles, importance of checking CBGs and maintaining good CBG control to prevent long-term and short-term complications.  Reviewed signs and symptoms of hyperglycemia and hypoglycemia and how to treat hypoglycemia at home.  Also reviewed blood sugar goals and A1c goals for home.    Discussed with pt that he will likely need insulin for home given his Glucose was >800 on admission and that he was having symptoms of High CBGs.  Explained what an insulin pen is and that I will recommend insulin pens when he goes home.  I will ask the Diabetes RN to show the insulin pen 2/15 as pt still quite sleepy today and remains on IV insulin this AM.  I also asked the primary RN to allow pt to give all injections for practice once pt off the IV Insulin drip.    I have asked Pharmacy services to perform benefits check for pt.  Looks like Lantus insulin pen and Humalog insulin pens will be the most affordable at $35 each.  Will need insulin pen needles as well.  Pt told me he has used his  Girlfriend's CBG meter at home (she had GDM) and that she said he could have his meter and he could use it as needed.  Pt is interested in CGM when he goes home but will still need traditional CBG meter as well.  Will need Rx for extra test strips.  Pharmacy told me in [facility] that Dexcom CGM is preferred and that pt will need PA for this--Pharmacy to run PA for the Dexcom.  RNs to provide ongoing basic DM education at bedside with this patient.  Have ordered educational booklet, insulin starter kit.  Have also placed RD consult for DM diet education for this patient.  Will order OP DM Education referral and attach several informational diabetes videos (as QR codes) onto pt's AVS.       --Will follow patient during hospitalization--  Wyn Quaker RN, MSN, [place] Diabetes Coordinator Inpatient Glycemic Control Team Team Pager: [phone] (8a-5p)

## 2022-04-01 NOTE — Inpatient Diabetes Management (Signed)
Inpatient Diabetes Program Recommendations  AACE/ADA: New Consensus Statement on Inpatient Glycemic Control (2015)  Target Ranges:  Prepandial:   less than 140 mg/dL      Peak postprandial:   less than 180 mg/dL (1-2 hours)      Critically ill patients:  140 - 180 mg/dL    Latest Reference Range & Units 04/01/22 00:34  Hemoglobin A1C 4.8 - 5.6 % 13.2 (H)  332 mg/dl  (H): Data is abnormally high  Latest Reference Range & Units 03/31/22 21:04  Sodium 135 - 145 mmol/L 130 (L)  Potassium 3.5 - 5.1 mmol/L 4.8  Chloride 98 - 111 mmol/L 95 (L)  CO2 22 - 32 mmol/L 19 (L)  Glucose 70 - 99 mg/dL 801 (HH) (C)  BUN 6 - 20 mg/dL 20  Creatinine 0.61 - 1.24 mg/dL 1.39 (H)  Calcium 8.9 - 10.3 mg/dL 9.4  Anion gap 5 - 15  16 (H)    Latest Reference Range & Units 03/31/22 21:04  Beta-Hydroxybutyric Acid 0.05 - 0.27 mmol/L 0.33 (H)  (H): Data is abnormally high  Latest Reference Range & Units 03/31/22 21:02 03/31/22 23:12 04/01/22 00:49 04/01/22 01:35 04/01/22 01:56 04/01/22 02:33 04/01/22 03:34 04/01/22 04:32  Glucose-Capillary 70 - 99 mg/dL >600 (HH) >600 (HH) 574 (HH)  IV Insulin Drip Started 510 (HH) 467 (H) 285 (H) 231 (H) 249 (H)  IV Insulin Drip Infusing  (HH): Data is critically high (H): Data is abnormally high  Latest Reference Range & Units 04/01/22 05:42 04/01/22 06:37  Glucose-Capillary 70 - 99 mg/dL 177 (H) 190 (H)  (H): Data is abnormally high  Admit with: New Diagnosis Diabetes Blurred vision, polyuria and increased thirst 20 pound unintentional weight loss over the past month and has been feeling very weak   Current Orders: IV Insulin Drip   PCP: Dr. Miguel Aschoff     MD- Note 4:45am BMET shows the following: Glucose 219 Anion Gap 11 CO2 level 23  When you allow pt to transition to SQ Insulin, please consider:  1. Start Semglee 25 units Daily (0.2 units/kg) Make sure to continue the IV Insulin Drip for 2 hours after Semglee administered and then can d/c the  Insulin Drip  2. Start Novolog Moderate Correction Scale/ SSI (0-15 units) TID AC + HS  Diabetes RN will plan to see pt today to discuss new diagnosis    --Will follow patient during hospitalization--  Wyn Quaker RN, MSN, [place] Diabetes Coordinator Inpatient Glycemic Control Team Team Pager: [phone] (8a-5p)

## 2022-04-01 NOTE — Assessment & Plan Note (Signed)
Replaced. °

## 2022-04-01 NOTE — Plan of Care (Signed)
  RD consulted for nutrition education regarding diabetes.   Lab Results  Component Value Date   HGBA1C 13.2 (H) 04/01/2022  PTA DM medications none.   Case discussed with RN, who reports pt is NPO and remains on insulin drip. RD will not complete calorie count (which is what MD consulted for), as pt is NPO.   Spoke with pt at bedside, who was pleasant and in good spirits at time of visit. Pt reports that he has been admitted "because there is too much sugar in my blood". Pt admits to feeling very thirsty and having increased urination PTA; explained to pt that these symptoms are very common signs of new onset DM. Pt typically consumes 2-3 meals per day (Breakfast: sausage biscuit and Mountain Dew or Subway sandwich; Lunch: skips; Dinner: meat and vegetable). Pt resides with his 38 year old daughter and the mother of their daughter. He admits to drinking a of of sugary beverages, including [place]; he frequently goes to [employer] and buys "Bi Gulp sized drinks. RD spent most of the visit discussing low sugar, low calorie drink alternatives. Pt open to suggestions.   Case discussed with DM coordinator; pt was sleepy when she visited and will follow-up again tomorrow (04/02/22). NDES referral have been placed.   Nutrition-Focused physical exam completed. Findings are no fat depletion, no muscle depletion, and no edema.    RD provided "Carbohydrate Counting for People with Diabetes" handout from the Academy of Nutrition and Dietetics. Discussed different food groups and their effects on blood sugar, emphasizing carbohydrate-containing foods. Provided list of carbohydrates and recommended serving sizes of common foods.  Discussed importance of controlled and consistent carbohydrate intake throughout the day. Provided examples of ways to balance meals/snacks and encouraged intake of high-fiber, whole grain complex carbohydrates. Teach back method used.  Expect fair to good  compliance.  Body mass index is 32.42 kg/m. Pt meets criteria for obesity, class I based on current BMI.Obesity is a complex, chronic medical condition that is optimally managed by a multidisciplinary care team. Weight loss is not an ideal goal for an acute inpatient hospitalization. However, if further work-up for obesity is warranted, consider outpatient referral to [place].    Current diet order is NPO, patient is consuming approximately n/a% of meals at this time. Labs and medications reviewed. No further nutrition interventions warranted at this time. RD contact information provided. If additional nutrition issues arise, please re-consult RD.  Loistine Chance, RD, LDN, [place] Registered Dietitian II Certified Diabetes Care and Education Specialist Please refer to [facility] for RD and/or RD on-call/weekend/after hours pager

## 2022-04-01 NOTE — TOC Benefit Eligibility Note (Signed)
Patient Teacher, English as a foreign language completed.    The patient is currently admitted and upon discharge could be taking Lantus.  The current 30 day co-pay is $35.00.   The patient is currently admitted and upon discharge could be taking Semglee.  The current 30 day co-pay is PA.   The patient is currently admitted and upon discharge could be taking Basaglar.  The current 30 day co-pay is PA .   The patient is currently admitted and upon discharge could be taking Toujeo.  The current 30 day co-pay is $45.00.   The patient is currently admitted and upon discharge could be taking Humalog.  The current 30 day co-pay is $35.00.   The patient is currently admitted and upon discharge could be taking Novolog.  The current 30 day co-pay is $45.00.   The patient is currently admitted and upon discharge could be taking [employer]estyle [employer].  The current 30 day co-pay is PA required, Dexcom preferred.   The patient is currently admitted and upon discharge could be taking Dexcom.  The current 30 day co-pay is PA required.   The patient is insured through [employer]

## 2022-04-02 ENCOUNTER — Other Ambulatory Visit: Payer: Self-pay

## 2022-04-02 DIAGNOSIS — R7989 Other specified abnormal findings of blood chemistry: Secondary | ICD-10-CM | POA: Diagnosis not present

## 2022-04-02 DIAGNOSIS — E876 Hypokalemia: Secondary | ICD-10-CM | POA: Diagnosis not present

## 2022-04-02 DIAGNOSIS — E1100 Type 2 diabetes mellitus with hyperosmolarity without nonketotic hyperglycemic-hyperosmolar coma (NKHHC): Secondary | ICD-10-CM | POA: Diagnosis not present

## 2022-04-02 DIAGNOSIS — N179 Acute kidney failure, unspecified: Secondary | ICD-10-CM | POA: Diagnosis not present

## 2022-04-02 LAB — CBC
HCT: 41 % (ref 39.0–52.0)
Hemoglobin: 14.2 g/dL (ref 13.0–17.0)
MCH: 31.5 pg (ref 26.0–34.0)
MCHC: 34.6 g/dL (ref 30.0–36.0)
MCV: 90.9 fL (ref 80.0–100.0)
Platelets: 142 10*3/uL — ABNORMAL LOW (ref 150–400)
RBC: 4.51 MIL/uL (ref 4.22–5.81)
RDW: 12.7 % (ref 11.5–15.5)
WBC: 5.3 10*3/uL (ref 4.0–10.5)
nRBC: 0 % (ref 0.0–0.2)

## 2022-04-02 LAB — GLUCOSE, CAPILLARY: Glucose-Capillary: 329 mg/dL — ABNORMAL HIGH (ref 70–99)

## 2022-04-02 MED ORDER — AMLODIPINE BESYLATE 5 MG PO TABS
5.0000 mg | ORAL_TABLET | Freq: Every day | ORAL | 0 refills | Status: AC
  Filled 2022-04-02: qty 30, 30d supply, fill #0

## 2022-04-02 MED ORDER — LANCET DEVICE MISC
1.0000 | Freq: Three times a day (TID) | 0 refills | Status: AC
  Filled 2022-04-02: qty 1, 30d supply, fill #0

## 2022-04-02 MED ORDER — DEXCOM G7 SENSOR MISC
1.0000 | 0 refills | Status: DC
  Filled 2022-04-02: qty 3, 30d supply, fill #0

## 2022-04-02 MED ORDER — LANTUS SOLOSTAR 100 UNIT/ML ~~LOC~~ SOPN
30.0000 [IU] | PEN_INJECTOR | Freq: Every day | SUBCUTANEOUS | 0 refills | Status: DC
  Filled 2022-04-02: qty 15, 50d supply, fill #0

## 2022-04-02 MED ORDER — PEN NEEDLES 32G X 4 MM MISC
1.0000 | Freq: Three times a day (TID) | 0 refills | Status: DC
  Filled 2022-04-02: qty 100, 25d supply, fill #0

## 2022-04-02 MED ORDER — ONETOUCH DELICA LANCETS 33G MISC
1.0000 | Freq: Three times a day (TID) | 0 refills | Status: AC
  Filled 2022-04-02: qty 100, 30d supply, fill #0

## 2022-04-02 MED ORDER — BLOOD GLUCOSE MONITOR SYSTEM W/DEVICE KIT
1.0000 | PACK | Freq: Three times a day (TID) | 0 refills | Status: AC
  Filled 2022-04-02: qty 1, fill #0

## 2022-04-02 MED ORDER — INSULIN ASPART 100 UNIT/ML IJ SOLN
3.0000 [IU] | Freq: Three times a day (TID) | INTRAMUSCULAR | Status: DC
  Administered 2022-04-02: 3 [IU] via SUBCUTANEOUS
  Filled 2022-04-02: qty 1

## 2022-04-02 MED ORDER — INSULIN LISPRO (1 UNIT DIAL) 100 UNIT/ML (KWIKPEN)
8.0000 [IU] | PEN_INJECTOR | Freq: Three times a day (TID) | SUBCUTANEOUS | 0 refills | Status: DC
  Filled 2022-04-02: qty 15, 63d supply, fill #0

## 2022-04-02 MED ORDER — GLUCOSE BLOOD VI STRP
1.0000 | ORAL_STRIP | Freq: Three times a day (TID) | 0 refills | Status: AC
  Filled 2022-04-02: qty 100, 30d supply, fill #0

## 2022-04-02 NOTE — Progress Notes (Deleted)
   I,Azara Gemme S Ayala Ribble,acting as a Education administrator for [employer], PA-C.,have documented all relevant documentation on the behalf of Mikey Kirschner, PA-C,as directed by  Mikey Kirschner, PA-C while in the presence of Mikey Kirschner, PA-C.     Established patient visit   Patient: Stuart Mclaughlin   DOB: [birth year]   37 y.o. Male  MRN: [id number] Visit Date: 04/03/2022  Today's healthcare provider: Mikey Kirschner, PA-C   No chief complaint on file.  Subjective    HPI  Follow up Hospitalization  Patient was admitted to [facility] on 03/31/22 and discharged on 04/02/22. He was treated for new onset type 2 diabetes. Treatment for this included started on insulin drop and converted to long-acting insulin. Lantus 30 units daily. Short acting insulin 8 units 3 times daily. Dexcom G7 prescribed. And PA was approved.  Telephone follow up was done on  He reports {excellent/good/fair:19992} compliance with treatment. He reports this condition is {resolved/improved/worsened:23923}.  ----------------------------------------------------------------------------------------- -   Medications: Outpatient Medications Prior to Visit  Medication Sig   amLODipine (NORVASC) 5 MG tablet Take 1 tablet (5 mg total) by mouth daily.   Blood Glucose Monitoring Suppl (BLOOD GLUCOSE MONITOR SYSTEM) w/Device KIT Test in the morning, at noon, and at bedtime.   Continuous Blood Gluc Sensor (DEXCOM G7 SENSOR) MISC Use every 10 days   glucose blood test strip Test in the morning, at noon, and at bedtime.   insulin glargine (LANTUS SOLOSTAR) 100 UNIT/ML Solostar Pen Inject 30 Units into the skin daily.   insulin lispro (HUMALOG) 100 UNIT/ML KwikPen Inject 8 Units into the skin 3 (three) times daily before meals.   Insulin Pen Needle (PEN NEEDLES) 32G X 4 MM MISC Use 3 (three) times daily before meals and with basaglar   Lancet Device MISC 1 each by Does not apply route in the morning, at noon, and at bedtime. May substitute to  any manufacturer covered by patient's insurance.   OneTouch Delica Lancets 02R MISC Test in the morning, at noon, and at bedtime.   No facility-administered medications prior to visit.    Review of Systems  {Labs  Heme  Chem  Endocrine  Serology  Results Review (optional):23779}   Objective    There were no vitals taken for this visit. {Show previous vital signs (optional):23777}  Physical Exam  ***  No results found for any visits on 04/03/22.  Assessment & Plan     ***  No follow-ups on file.      {provider attestation***:1}   Mikey Kirschner, PA-C  [place] [phone] (phone) [phone] (fax)  [place]

## 2022-04-02 NOTE — Progress Notes (Signed)
Discharge instructions reviewed with patient including followup visits and new medications.  Understanding was verbalized and all questions were answered.  IV removed without complication; patient tolerated well.  Patient discharged home ambulatory in stable condition escorted by nursing staff.

## 2022-04-02 NOTE — Inpatient Diabetes Management (Signed)
Inpatient Diabetes Program Recommendations  AACE/ADA: New Consensus Statement on Inpatient Glycemic Control (2015)  Target Ranges:  Prepandial:   less than 140 mg/dL      Peak postprandial:   less than 180 mg/dL (1-2 hours)      Critically ill patients:  140 - 180 mg/dL   Lab Results  Component Value Date   GLUCAP 329 (H) 04/02/2022   HGBA1C 13.2 (H) 04/01/2022    Review of Glycemic Control  Diabetes history: New Onset DM Current orders for Inpatient glycemic control: Semglee 25 units, Novolog 3 units tid meal coverage added this am  Inpatient Diabetes Program Recommendations:   Educated patient on insulin pen use at home. Reviewed contents of insulin flexpen starter kit. Reviewed all steps if insulin pen including attachment of needle, 2-unit air shot, dialing up dose, giving injection, removing needle, disposal of sharps, storage of unused insulin, disposal of insulin etc. Patient able to provide successful return demonstration. Also reviewed troubleshooting with insulin pen. MD to give patient Rxs for insulin pens and insulin pen needles.   Patient given 2 free Dexcom G7 sensors to take home due to application for sensor not available on current Iphone. States his Android phone is newer that he has @ home. Asked patient to request assistance with pharmacist if unable to pull up application on his current phone @ home. Discussed with Dr. Leslye Peer.  Agree with home regimen. Discussed basal and short acting in depth with patient along with hypoglycemia protocol reviewed again. Answered basic questions regarding diabetes management.  Thank you, Stuart Mclaughlin. Shaili Donalson, RN, MSN, CDE  Diabetes Coordinator Inpatient Glycemic Control Team Team Pager [phone] (8am-5pm) 04/02/2022 10:22 AM

## 2022-04-02 NOTE — Discharge Summary (Signed)
Physician Discharge Summary   Patient: Stuart Mclaughlin MRN: [id number] DOB: [birth year]  Admit date:     03/31/2022  Discharge date: 04/02/22  Discharge Physician: Loletha Grayer   PCP: Stuart Post, MD   Recommendations at discharge:   Follow-up [facility] 5 days  Discharge Diagnoses: Principal Problem:   Hyperosmolar non-ketotic state due to new onset  type 2 diabetes mellitus ([name]) Active Problems:   AKI (acute kidney injury) ([place])   Pseudohyponatremia   Hypokalemia   HTN (hypertension)   Obesity (BMI 30-39.9)    Hospital Course: 37 y.o. male with medical history significant for Hypertension who presents to the ED with a complaint of blurred vision, polyuria and increased thirst.  He has had a 20 pound unintentional weight loss over the past month and has been feeling very weak.  He checked his blood sugar and the reading was high.  He has no prior history of diabetes.  He denies any recent illness apart from the above.  He denies nausea, vomiting, abdominal pain, diarrhea.  Denies cough, shortness of breath or chest pain. ED course and data review: On arrival BP 153/110 with otherwise normal vitals.  Labs significant for blood glucose 801 with a bicarb of 19 and anion gap of 16.  Creatinine 0000000, baseline uncertain.  Beta hydroxybutyric acid 0.33, venous pH 7.39.  CBC unremarkable. Patient was given a 2 L LR bolus and 8 units IV insulin and hospitalist consulted for admission  2/14.  Patient will come off insulin drip at 5 PM.  Semglee insulin 25 units daily.  Will advance diet.  Nursing staff to teach him how to inject insulin.  Diet exercise and weight loss discussed at the bedside by me earlier today. 2/15.  Increase long-acting insulin to 30 units daily.  Will give 8 units short acting insulin prior to meals.  Diet exercise and weight loss discussed.  Follow-up with PCP.  Assessment and Plan: * Hyperosmolar non-ketotic state due to new onset  type 2  diabetes mellitus (Stuart Mclaughlin) Sugar of 801 on presentation.  Patient started on insulin drip and converted to long-acting insulin on the evening of 2/14.  Nursing staff taught the patient how to inject insulin.  Seen by diabetes coordinator and dietitian.  He is a new diabetic.  Diet exercise and weight loss discussed by me.  Upon discharge Lantus insulin 30 units on a daily basis and short acting insulin 8 units 3 times daily.  Try to set up the Dexcom monitoring for sugars.  Will also give a glucometer.  AKI (acute kidney injury) (Stuart Mclaughlin) Creatinine 1.39 on presentation down to 0.96.  Pseudohyponatremia Secondary to elevated sugars.  Sodium in the normal range.  Hypokalemia Replaced  HTN (hypertension) Continue home amlodipine  Obesity (BMI 30-39.9) BMI 32.42 with current height and weight in computer.  Diet exercise and weight loss discussed by me.         Consultants: Diabetes coordinator, dietitian Procedures performed: None Disposition: Home Diet recommendation:  Cardiac and Carb modified diet DISCHARGE MEDICATION: Allergies as of 04/02/2022   No Known Allergies      Medication List     STOP taking these medications    dicyclomine 10 MG capsule Commonly known as: BENTYL       TAKE these medications    amLODipine 5 MG tablet Commonly known as: NORVASC Take 1 tablet (5 mg total) by mouth daily.   Blood Glucose Monitor System w/Device Kit Test in the morning, at noon, and  at bedtime.   Dexcom G7 Sensor Misc Use every 10 days   glucose blood test strip Test in the morning, at noon, and at bedtime.   insulin lispro 100 UNIT/ML KwikPen Commonly known as: HUMALOG Inject 8 Units into the skin 3 (three) times daily before meals.   Lancet Device Misc 1 each by Does not apply route in the morning, at noon, and at bedtime. May substitute to any manufacturer covered by patient's insurance.   Lantus SoloStar 100 UNIT/ML Solostar Pen Generic drug: insulin  glargine Inject 30 Units into the skin daily.   OneTouch Delica Lancets [id number] Misc Test in the morning, at noon, and at bedtime.   Pen Needles 32G X 4 MM Misc Use 3 (three) times daily before meals and with basaglar        Follow-up Information     [place]. Go on 04/03/2022.   Why: Friday, February 16 at 8:40 a.m.  [id number]               Discharge Exam: [facility] Weights   03/31/22 2101 04/01/22 0200  Weight: 117.9 kg 124 kg   Physical Exam HENT:     Head: Normocephalic.     Mouth/Throat:     Pharynx: No oropharyngeal exudate.  Eyes:     General: Lids are normal.     Conjunctiva/sclera: Conjunctivae normal.  Cardiovascular:     Rate and Rhythm: Normal rate and regular rhythm.     Heart sounds: Normal heart sounds, S1 normal and S2 normal.  Pulmonary:     Breath sounds: Normal breath sounds. No decreased breath sounds, wheezing, rhonchi or rales.  Abdominal:     Palpations: Abdomen is soft.     Tenderness: There is no abdominal tenderness.  Musculoskeletal:     Right lower leg: No swelling.     Left lower leg: No swelling.  Skin:    General: Skin is warm.     Findings: No rash.  Neurological:     Mental Status: He is alert.      Condition at discharge: stable  The results of significant diagnostics from this hospitalization (including imaging, microbiology, ancillary and laboratory) are listed below for reference.   Imaging Studies: No results found.  Microbiology: Results for orders placed or performed during the hospital encounter of 03/31/22  MRSA Next Gen by PCR, Nasal     Status: None   Collection Time: 04/01/22  1:55 AM   Specimen: Nasal Mucosa; Nasal Swab  Result Value Ref Range Status   MRSA by PCR Next Gen NOT DETECTED NOT DETECTED Final    Comment: (NOTE) The GeneXpert MRSA Assay (FDA approved for NASAL specimens only), is one component of a comprehensive MRSA colonization surveillance program. It is not intended to  diagnose MRSA infection nor to guide or monitor treatment for MRSA infections. Test performance is not FDA approved in patients less than 51 years old. Performed at [facility], [address] [place], [place] [address]     Labs: CBC: Recent Labs  Lab 03/31/22 2104 04/02/22 0511  WBC 5.5 5.3  HGB 16.3 14.2  HCT 44.5 41.0  MCV 89.0 90.9  PLT 172 [id number]*   Basic Metabolic Panel: Recent Labs  Lab 04/01/22 0223 04/01/22 0445 04/01/22 0843 04/01/22 1131 04/01/22 1538  NA 137 136 138 140 139  K 3.6 3.6 3.4* 3.4* 3.8  CL 104 102 106 105 106  CO2 20* 23 24 26 26  $ GLUCOSE 337* 219* 192* 186*  150*  BUN 16 15 14 13 11  $ CREATININE 1.20 1.01 0.85 1.00 0.96  CALCIUM 9.4 9.2 8.8* 8.8* 8.9   Liver Function Tests: Recent Labs  Lab 03/31/22 2104  AST 38  ALT 49*  ALKPHOS 98  BILITOT 1.2  PROT 7.4  ALBUMIN 3.8   CBG: Recent Labs  Lab 04/01/22 1503 04/01/22 1605 04/01/22 1702 04/01/22 2053 04/02/22 0843  GLUCAP 151* 158* 179* 331* 329*    Discharge time spent: greater than 30 minutes.  Signed: Loletha Grayer, MD [facility] 04/02/2022

## 2022-04-02 NOTE — Plan of Care (Signed)

## 2022-04-03 ENCOUNTER — Ambulatory Visit: Payer: BC Managed Care – PPO | Admitting: Physician Assistant

## 2022-04-08 ENCOUNTER — Ambulatory Visit (INDEPENDENT_AMBULATORY_CARE_PROVIDER_SITE_OTHER): Payer: BC Managed Care – PPO | Admitting: Physician Assistant

## 2022-04-08 ENCOUNTER — Encounter: Payer: Self-pay | Admitting: Physician Assistant

## 2022-04-08 VITALS — BP 134/91 | HR 86 | Temp 97.8°F | Ht 76.5 in | Wt 272.0 lb

## 2022-04-08 DIAGNOSIS — E1159 Type 2 diabetes mellitus with other circulatory complications: Secondary | ICD-10-CM | POA: Diagnosis not present

## 2022-04-08 DIAGNOSIS — Z794 Long term (current) use of insulin: Secondary | ICD-10-CM

## 2022-04-08 DIAGNOSIS — I152 Hypertension secondary to endocrine disorders: Secondary | ICD-10-CM

## 2022-04-08 DIAGNOSIS — E1165 Type 2 diabetes mellitus with hyperglycemia: Secondary | ICD-10-CM | POA: Diagnosis not present

## 2022-04-08 MED ORDER — METFORMIN HCL ER 750 MG PO TB24: 750.0000 mg | ORAL_TABLET | Freq: Every day | ORAL | 1 refills | Status: DC

## 2022-04-08 NOTE — Assessment & Plan Note (Signed)
Pt only back to amlodipine 5 mg x 1 week. Continue dose and monitor bp at home F/u 3 mo

## 2022-04-08 NOTE — Assessment & Plan Note (Signed)
Further educated on consequences of uncontrolled blood sugar. Discussed different agents for type 2 DM control; pt prefers oral agents.  Will start on metformin 750 mg XR daily x 1 week and then BID. Advised pt to closely monitor his BS -- he will be able to stop mealtime insulin first as his bs comes down.  Advised to watch his sugar after larger meals- if still spiking to 300+ will need to keep mealtime insulin.   Will check lipid panel today.   Foot exam deferred to next visit, pt denies any peripheral neuropathy concerns.  Referral to opthalmology .  F/u 3 mo or earlier if needed for more guidance.

## 2022-04-08 NOTE — Progress Notes (Signed)
New patient visit   Patient: Stuart Mclaughlin   DOB: [birth year]   37 y.o. Male  MRN: [id number] Visit Date: 04/08/2022  Today's healthcare provider: Mikey Kirschner, PA-C   Cc. New dm II dx  Subjective    Stuart Mclaughlin is a 37 y.o. male who presents today as a new patient to establish care.   He was admitted to [facility] 03/31/22 and discharged 04/02/22 d/t a hyperosmolar nonketotic state, new onset type 2 DM.  Blood sugar on presentation was 801, pt had symptoms of blurred vision and weakness. Reports unintentional weight loss over the past month.  Elevated BP on presentation to 153/110.   Pt was monitored, placed on insulin drip. Sent home with basal insulin 30 U and mealtime insulin 8 U. He was given a dexcom CGM.   Pt had a history of HTN, was treated previously with amlodipine, restarted 5 mg in hospital.   Today he reports excellent compliance with injections and monitoring. Fasting BS around 300-350. He reports watching his meals. Denies immediate family history of diabetes. Denies blurred vision today.   Past Medical History:  Diagnosis Date   Allergy    Arthritis    Deep vein thrombosis (DVT) (HCC)    Hypertension    Past Surgical History:  Procedure Laterality Date   PERIPHERAL VASCULAR CATHETERIZATION  04/01/2015   Procedure: Lower Extremity Venography;  Surgeon: Algernon Huxley, MD;  Location: [facility];  Service: Cardiovascular;;   PERIPHERAL VASCULAR CATHETERIZATION Right 04/01/2015   Procedure: Lower Extremity Intervention;  Surgeon: Algernon Huxley, MD;  Location: [place] [facility];  Service: Cardiovascular;  Laterality: Right;   PERIPHERAL VASCULAR CATHETERIZATION N/A 04/01/2015   Procedure: IVC Filter Insertion;  Surgeon: Algernon Huxley, MD;  Location: [place] [facility];  Service: Cardiovascular;  Laterality: N/A;   No family status information on file.   History reviewed. No pertinent family history. Social History   Socioeconomic History    Marital status: Single    Spouse name: Not on file   Number of children: Not on file   Years of education: Not on file   Highest education level: Not on file  Occupational History   Not on file  Tobacco Use   Smoking status: Former    Types: Cigarettes   Smokeless tobacco: Never   Tobacco comments:    Former smoker 1-2 years; quit 2022  Vaping Use   Vaping Use: Never used  Substance and Sexual Activity   Alcohol use: Yes    Alcohol/week: 2.0 standard drinks of alcohol    Types: 2 Cans of beer per week   Drug use: No   Sexual activity: Not on file  Other Topics Concern   Not on file  Social History Narrative   Not on file   Social Determinants of Health   Financial Resource Strain: Not on file  Food Insecurity: No Food Insecurity (04/01/2022)   Hunger Vital Sign    Worried About Running Out of Food in the Last Year: Never true    Ran Out of Food in the Last Year: Never true  Transportation Needs: No Transportation Needs (04/01/2022)   PRAPARE - Hydrologist (Medical): No    Lack of Transportation (Non-Medical): No  Physical Activity: Not on file  Stress: Not on file  Social Connections: Not on file   Outpatient Medications Prior to Visit  Medication Sig   amLODipine (NORVASC) 5 MG tablet Take  1 tablet (5 mg total) by mouth daily.   Blood Glucose Monitoring Suppl (BLOOD GLUCOSE MONITOR SYSTEM) w/Device KIT Test in the morning, at noon, and at bedtime.   Continuous Blood Gluc Sensor (DEXCOM G7 SENSOR) MISC Use every 10 days   glucose blood test strip Test in the morning, at noon, and at bedtime.   insulin glargine (LANTUS SOLOSTAR) 100 UNIT/ML Solostar Pen Inject 30 Units into the skin daily.   insulin lispro (HUMALOG) 100 UNIT/ML KwikPen Inject 8 Units into the skin 3 (three) times daily before meals.   Insulin Pen Needle (PEN NEEDLES) 32G X 4 MM MISC Use 3 (three) times daily before meals and with basaglar   Lancet Device MISC 1 each by Does  not apply route in the morning, at noon, and at bedtime. May substitute to any manufacturer covered by patient's insurance.   OneTouch Delica Lancets [id number] MISC Test in the morning, at noon, and at bedtime.   No facility-administered medications prior to visit.   No Known Allergies  Immunization History  Administered Date(s) Administered   Tdap 11/11/2017    Health Maintenance  Topic Date Due   COVID-19 Vaccine (1) Never done   FOOT EXAM  Never done   OPHTHALMOLOGY EXAM  Never done   Diabetic kidney evaluation - Urine ACR  Never done   INFLUENZA VACCINE  05/17/2022 (Originally 09/16/2021)   HEMOGLOBIN A1C  09/30/2022   Diabetic kidney evaluation - eGFR measurement  04/02/2023   DTaP/Tdap/Td (2 - Td or Tdap) 11/12/2027   Hepatitis C Screening  Completed   HIV Screening  Completed   HPV VACCINES  Aged Out    Patient Care Team: Mikey Kirschner, PA-C as PCP - General (Physician Assistant)  Review of Systems  Constitutional:  Negative for fatigue and fever.  Respiratory:  Negative for cough and shortness of breath.   Cardiovascular:  Negative for chest pain, palpitations and leg swelling.  Neurological:  Negative for dizziness and headaches.       Objective    BP (!) 134/91 (BP Location: Left Arm, Patient Position: Sitting, Cuff Size: Normal)   Pulse 86   Temp 97.8 F (36.6 C)   Ht 6' 4.5" (1.943 m)   Wt 272 lb (123.4 kg)   SpO2 98%   BMI 32.68 kg/m   Physical Exam Constitutional:      General: He is awake.     Appearance: He is well-developed.  HENT:     Head: Normocephalic.  Eyes:     Conjunctiva/sclera: Conjunctivae normal.  Cardiovascular:     Rate and Rhythm: Normal rate and regular rhythm.     Heart sounds: Normal heart sounds.  Pulmonary:     Effort: Pulmonary effort is normal.     Breath sounds: Normal breath sounds.  Skin:    General: Skin is warm.  Neurological:     Mental Status: He is alert and oriented to person, place, and time.  Psychiatric:         Attention and Perception: Attention normal.        Mood and Affect: Mood normal.        Speech: Speech normal.        Behavior: Behavior is cooperative.     Depression Screen    04/08/2022    9:14 AM 03/29/2019    3:10 PM  PHQ 2/9 Scores  PHQ - 2 Score 2 0  PHQ- 9 Score 7 0   No results found for any visits on 04/08/22.  Assessment & Plan      Problem List Items Addressed This Visit       Cardiovascular and Mediastinum   Hypertension associated with diabetes ([place])    Pt only back to amlodipine 5 mg x 1 week. Continue dose and monitor bp at home F/u 3 mo       Relevant Medications   metFORMIN (GLUCOPHAGE-XR) 750 MG 24 hr tablet   Other Relevant Orders   Urine Microalbumin w/creat. ratio     Endocrine   Type 2 diabetes mellitus with hyperglycemia ([place]) - Primary    Further educated on consequences of uncontrolled blood sugar. Discussed different agents for type 2 DM control; pt prefers oral agents.  Will start on metformin 750 mg XR daily x 1 week and then BID. Advised pt to closely monitor his BS -- he will be able to stop mealtime insulin first as his bs comes down.  Advised to watch his sugar after larger meals- if still spiking to 300+ will need to keep mealtime insulin.   Will check lipid panel today.   Foot exam deferred to next visit, pt denies any peripheral neuropathy concerns.  Referral to opthalmology .  F/u 3 mo or earlier if needed for more guidance.      Relevant Medications   metFORMIN (GLUCOPHAGE-XR) 750 MG 24 hr tablet   Other Relevant Orders   Lipid panel   Ambulatory referral to Ophthalmology    Return in about 3 months (around 07/07/2022) for DMII.     I, Mikey Kirschner, PA-C have reviewed all documentation for this visit. The documentation on  04/08/22 for the exam, diagnosis, procedures, and orders are all accurate and complete.  Mikey Kirschner, PA-C [facility] [address] [place], [place],  [address] Office: [phone] Fax: [place]

## 2022-04-09 ENCOUNTER — Other Ambulatory Visit: Payer: Self-pay | Admitting: Physician Assistant

## 2022-04-09 ENCOUNTER — Telehealth: Payer: Self-pay | Admitting: Physician Assistant

## 2022-04-09 DIAGNOSIS — E781 Pure hyperglyceridemia: Secondary | ICD-10-CM

## 2022-04-09 MED ORDER — ICOSAPENT ETHYL 1 G PO CAPS: 2.0000 g | ORAL_CAPSULE | Freq: Two times a day (BID) | ORAL | 1 refills | Status: DC

## 2022-04-09 NOTE — Telephone Encounter (Signed)
Recieved a fax from covermymeds ([facility]) for IGM Capsules has been started.   [id number]

## 2022-04-10 LAB — MICROALBUMIN / CREATININE URINE RATIO
Creatinine, Urine: 51.4 mg/dL
Microalb/Creat Ratio: <6 mg/g creat (ref 0–29)
Microalbumin, Urine: <3 ug/mL

## 2022-04-10 LAB — LIPID PANEL
Chol/HDL Ratio: 7.8 ratio — ABNORMAL HIGH (ref 0.0–5.0)
Cholesterol, Total: 280 mg/dL — ABNORMAL HIGH (ref 100–199)
HDL: 36 mg/dL — ABNORMAL LOW (ref >39)
LDL Chol Calc (NIH): 111 mg/dL — ABNORMAL HIGH (ref 0–99)
Triglycerides: 746 mg/dL (ref 0–149)
VLDL Cholesterol Cal: 133 mg/dL — ABNORMAL HIGH (ref 5–40)

## 2022-04-13 ENCOUNTER — Telehealth: Payer: Self-pay | Admitting: Physician Assistant

## 2022-04-13 NOTE — Telephone Encounter (Signed)
Key: [id number] [employer] CoverMyMeds Prior Authorization Follow Up Vascepa IGM Capsules

## 2022-04-14 NOTE — Telephone Encounter (Signed)
Status: PA RequestCreated: February 22nd, 2024 [id number]: February 27th, 2024

## 2022-04-14 NOTE — Telephone Encounter (Signed)
Duplicate encounter  Status: PA RequestCreated: February 22nd, 2024 [id number]: February 27th, 2024

## 2022-04-23 ENCOUNTER — Ambulatory Visit: Payer: BC Managed Care – PPO | Admitting: *Deleted

## 2022-05-01 ENCOUNTER — Telehealth: Payer: Self-pay | Admitting: Physician Assistant

## 2022-05-01 NOTE — Telephone Encounter (Signed)
[facility] requesting prior authorization [employer]: [place] Name: [place] Vascepa 1 GM Capsules

## 2022-05-04 ENCOUNTER — Ambulatory Visit: Payer: BC Managed Care – PPO | Admitting: *Deleted

## 2022-05-05 NOTE — Telephone Encounter (Signed)
Stuart Mclaughlin (Key: [id number]) PA Case ID #: [id number] Rx #: [id number]  Outcome: Approved on February 27 Authorization Expiration Date: 04/14/2023

## 2022-07-01 ENCOUNTER — Ambulatory Visit: Payer: BC Managed Care – PPO | Admitting: Dietician

## 2022-07-08 ENCOUNTER — Ambulatory Visit: Payer: BC Managed Care – PPO | Admitting: Physician Assistant

## 2022-07-08 ENCOUNTER — Encounter: Payer: Self-pay | Admitting: Physician Assistant

## 2022-07-08 VITALS — BP 131/101 | HR 80 | Temp 98.2°F | Resp 14 | Ht 76.0 in | Wt 273.3 lb

## 2022-07-08 DIAGNOSIS — I152 Hypertension secondary to endocrine disorders: Secondary | ICD-10-CM | POA: Diagnosis not present

## 2022-07-08 DIAGNOSIS — E1165 Type 2 diabetes mellitus with hyperglycemia: Secondary | ICD-10-CM | POA: Diagnosis not present

## 2022-07-08 DIAGNOSIS — Z794 Long term (current) use of insulin: Secondary | ICD-10-CM

## 2022-07-08 DIAGNOSIS — E781 Pure hyperglyceridemia: Secondary | ICD-10-CM | POA: Diagnosis not present

## 2022-07-08 DIAGNOSIS — E1159 Type 2 diabetes mellitus with other circulatory complications: Secondary | ICD-10-CM

## 2022-07-08 MED ORDER — DEXCOM G7 SENSOR MISC: 1.0000 | 3 refills | Status: AC

## 2022-07-08 MED ORDER — HYDROCHLOROTHIAZIDE 12.5 MG PO CAPS: 12.5000 mg | ORAL_CAPSULE | Freq: Every day | ORAL | 1 refills | Status: DC

## 2022-07-08 NOTE — Progress Notes (Unsigned)
I,Stuart  Mclaughlin,acting as a Neurosurgeon for [employer], PA-C.,have documented all relevant documentation on the behalf of Stuart Ferguson, PA-C,as directed by  Stuart Ferguson, PA-C while in the presence of Stuart Ferguson, PA-C.    Established patient visit   Patient: Stuart Mclaughlin   DOB: [birth year]   37 y.o. Male  MRN: [id number] Visit Date: 07/08/2022  Today's healthcare provider: [name], PA-C   No chief complaint on file.  Subjective    HPI    Diabetes Mellitus Type II, Follow-up  Lab Results  Component Value Date   HGBA1C 13.2 (H) 04/01/2022   Wt Readings from Last 3 Encounters:  04/08/22 272 lb (123.4 kg)  04/01/22 273 lb 5.9 oz (124 kg)  07/28/21 270 lb (122.5 kg)   Last seen for diabetes 2 months ago.  Management since then includes starting metformin 750mg . He reports good compliance with treatment. He is not having side effects. Symptoms: No fatigue No foot ulcerations  No appetite changes No nausea  No paresthesia of the feet  No polydipsia  No polyuria No visual disturbances   No vomiting     Home blood sugar records:  today was 133  Episodes of hypoglycemia? {Yes/No:20286} {enter symptoms and frequency of symptoms if yes:1}   Current insulin regiment: {enter 'none' or type of insulin and number of units taken with each dose of each insulin formulation that the patient is taking:1} Most Recent Eye Exam: *** {Current exercise:16438:::1} {Current diet habits:16563:::1}  Pertinent Labs: Lab Results  Component Value Date   CHOL 280 (H) 04/08/2022   HDL 36 (L) 04/08/2022   LDLCALC 111 (H) 04/08/2022   TRIG 746 (HH) 04/08/2022   CHOLHDL 7.8 (H) 04/08/2022   Lab Results  Component Value Date   NA 139 04/01/2022   K 3.8 04/01/2022   CREATININE 0.96 04/01/2022   GFRNONAA >60 04/01/2022   LABMICR <3.0 04/08/2022   MICRALBCREAT <6 04/08/2022      --------------------------------------------------------------------------------------------------- Hypertension, follow-up  BP Readings from Last 3 Encounters:  04/08/22 (!) 134/91  04/02/22 137/83  07/28/21 (!) 140/99   Wt Readings from Last 3 Encounters:  04/08/22 272 lb (123.4 kg)  04/01/22 273 lb 5.9 oz (124 kg)  07/28/21 270 lb (122.5 kg)     He was last seen for hypertension 2 months ago.  BP at that visit was 134/91. Management since that visit includes ***.  He reports good compliance with treatment. He is not having side effects. {document side effects if present:1} He is following a {diet:21022986} diet. He {is/is not:9024} exercising. He {does/does not:200015} smoke.  Use of agents associated with hypertension: {bp agents assoc with hypertension:511::"none"}.   Outside blood pressures are {***enter patient reported home BP readings, or 'not being checked':1}. Symptoms: No chest pain No chest pressure  No palpitations No syncope  No dyspnea No orthopnea  No paroxysmal nocturnal dyspnea No lower extremity edema   Pertinent labs Lab Results  Component Value Date   CHOL 280 (H) 04/08/2022   HDL 36 (L) 04/08/2022   LDLCALC 111 (H) 04/08/2022   TRIG 746 (HH) 04/08/2022   CHOLHDL 7.8 (H) 04/08/2022   Lab Results  Component Value Date   NA 139 04/01/2022   K 3.8 04/01/2022   CREATININE 0.96 04/01/2022   GFRNONAA >60 04/01/2022   GLUCOSE 150 (H) 04/01/2022   TSH 1.740 03/30/2019     The ASCVD Risk score (Arnett DK, et al., 2019) failed to calculate for the following reasons:  The 2019 ASCVD risk score is only valid for ages 54 to 35  --------------------------------------------------------------------------------------------------- Lipid/Cholesterol, Follow-up  Last lipid panel Other pertinent labs  Lab Results  Component Value Date   CHOL 280 (H) 04/08/2022   HDL 36 (L) 04/08/2022   LDLCALC 111 (H) 04/08/2022   TRIG 746 (HH) 04/08/2022   CHOLHDL  7.8 (H) 04/08/2022   Lab Results  Component Value Date   ALT 49 (H) 03/31/2022   AST 38 03/31/2022   PLT 142 (L) 04/02/2022   TSH 1.740 03/30/2019     He was last seen for this 2 months ago.  Management since that visit includes ***.  He reports {excellent/good/fair/poor:19665} compliance with treatment. He {is/is not:9024} having side effects. {document side effects if present:1}  Symptoms: No chest pain No chest pressure/discomfort  No dyspnea No lower extremity edema  No numbness or tingling of extremity No orthopnea  No palpitations No paroxysmal nocturnal dyspnea  No speech difficulty No syncope   Current diet: {diet habits:16563} Current exercise: {exercise types:16438}  The ASCVD Risk score (Arnett DK, et al., 2019) failed to calculate for the following reasons:   The 2019 ASCVD risk score is only valid for ages 61 to 69  ---------------------------------------------------------------------------------------------------  Medications: Outpatient Medications Prior to Visit  Medication Sig   amLODipine (NORVASC) 5 MG tablet Take 1 tablet (5 mg total) by mouth daily.   Blood Glucose Monitoring Suppl (BLOOD GLUCOSE MONITOR SYSTEM) w/Device KIT Test in the morning, at noon, and at bedtime.   Continuous Blood Gluc Sensor (DEXCOM G7 SENSOR) MISC Use every 10 days   icosapent Ethyl (VASCEPA) 1 g capsule Take 2 capsules (2 g total) by mouth 2 (two) times daily.   insulin glargine (LANTUS SOLOSTAR) 100 UNIT/ML Solostar Pen Inject 30 Units into the skin daily.   insulin lispro (HUMALOG) 100 UNIT/ML KwikPen Inject 8 Units into the skin 3 (three) times daily before meals.   Insulin Pen Needle (PEN NEEDLES) 32G X 4 MM MISC Use 3 (three) times daily before meals and with basaglar   metFORMIN (GLUCOPHAGE-XR) 750 MG 24 hr tablet Take 1 tablet (750 mg total) by mouth daily with breakfast. For one week and then take two daily, one with breakfast and one with dinner   No  facility-administered medications prior to visit.    Review of Systems  All other systems reviewed and are negative.     Objective    There were no vitals taken for this visit.  Physical Exam Constitutional:      General: He is awake.     Appearance: He is well-developed.  HENT:     Head: Normocephalic.  Eyes:     Conjunctiva/sclera: Conjunctivae normal.  Cardiovascular:     Rate and Rhythm: Normal rate and regular rhythm.     Heart sounds: Normal heart sounds.  Pulmonary:     Effort: Pulmonary effort is normal.     Breath sounds: Normal breath sounds.  Skin:    General: Skin is warm.  Neurological:     Mental Status: He is alert and oriented to person, place, and time.  Psychiatric:        Attention and Perception: Attention normal.        Mood and Affect: Mood normal.        Speech: Speech normal.        Behavior: Behavior is cooperative.     ***  No results found for any visits on 07/08/22.  Assessment & Plan  Not taking meds  No follow-ups on file.      I, Stuart Ferguson, PA-C have reviewed all documentation for this visit. The documentation on  07/08/22   for the exam, diagnosis, procedures, and orders are all accurate and complete.  Stuart Ferguson, PA-C [facility] [address] [place], [place], [address] Office: [phone] Fax: [phone]   [facility]

## 2022-07-09 ENCOUNTER — Telehealth: Payer: Self-pay | Admitting: Physician Assistant

## 2022-07-09 ENCOUNTER — Encounter: Payer: Self-pay | Admitting: Physician Assistant

## 2022-07-09 LAB — HEMOGLOBIN A1C
Est. average glucose Bld gHb Est-mCnc: 263 mg/dL
Hgb A1c MFr Bld: 10.8 % — ABNORMAL HIGH (ref 4.8–5.6)

## 2022-07-09 LAB — LIPID PANEL
Chol/HDL Ratio: 5.9 ratio — ABNORMAL HIGH (ref 0.0–5.0)
Cholesterol, Total: 264 mg/dL — ABNORMAL HIGH (ref 100–199)
HDL: 45 mg/dL (ref >39)
LDL Chol Calc (NIH): 170 mg/dL — ABNORMAL HIGH (ref 0–99)
Triglycerides: 257 mg/dL — ABNORMAL HIGH (ref 0–149)
VLDL Cholesterol Cal: 49 mg/dL — ABNORMAL HIGH (ref 5–40)

## 2022-07-09 NOTE — Assessment & Plan Note (Signed)
Pt not consistently taking meds or checking BS . Lantus 30 U nightly, and humalog 8 U w/ meals. Metformin 750 mg  Foot exam deferred as today's visit was spent stressing education  Pt not wearing dexcom today.  Ordered A1c will titrate meds as indicated

## 2022-07-09 NOTE — Telephone Encounter (Signed)
See result note; pt also needs to start a cholesterol medication.  I am sending in crestor 20 mg

## 2022-07-09 NOTE — Assessment & Plan Note (Signed)
Pt not regularly taking meds.  Stressed importance. Cont amlo 5 and adding hctz 12.5  F/u 1 mo

## 2022-07-28 ENCOUNTER — Emergency Department: Payer: BC Managed Care – PPO

## 2022-07-28 ENCOUNTER — Emergency Department
Admission: EM | Admit: 2022-07-28 | Discharge: 2022-07-28 | Disposition: A | Discharge status: Home / Self Care | Payer: BC Managed Care – PPO | Attending: Emergency Medicine | Admitting: Emergency Medicine

## 2022-07-28 ENCOUNTER — Encounter: Payer: Self-pay | Admitting: Emergency Medicine

## 2022-07-28 ENCOUNTER — Other Ambulatory Visit: Payer: Self-pay

## 2022-07-28 DIAGNOSIS — M7989 Other specified soft tissue disorders: Secondary | ICD-10-CM | POA: Diagnosis not present

## 2022-07-28 DIAGNOSIS — E119 Type 2 diabetes mellitus without complications: Secondary | ICD-10-CM | POA: Insufficient documentation

## 2022-07-28 DIAGNOSIS — M7021 Olecranon bursitis, right elbow: Secondary | ICD-10-CM | POA: Insufficient documentation

## 2022-07-28 DIAGNOSIS — M25521 Pain in right elbow: Secondary | ICD-10-CM | POA: Diagnosis not present

## 2022-07-28 DIAGNOSIS — Y939 Activity, unspecified: Secondary | ICD-10-CM | POA: Insufficient documentation

## 2022-07-28 DIAGNOSIS — I10 Essential (primary) hypertension: Secondary | ICD-10-CM | POA: Insufficient documentation

## 2022-07-28 NOTE — ED Triage Notes (Signed)
Pt sts that he has been having right elbow swelling for the last 2-3 weeks. Pt denies any injury to the area. Pt has complete ROM of ext.

## 2022-07-28 NOTE — Discharge Instructions (Signed)
Wear the Ace wrap during the day, and apply ice at night.  Please return for any new, worsening, or change in symptoms or other concerns.  If your symptoms persist or do not improve, please follow-up with orthopedics.  It was a pleasure caring for you today.

## 2022-07-28 NOTE — ED Provider Notes (Signed)
[facility] Provider Note    Event Date/Time   First MD Initiated Contact with Patient 07/28/22 1523     (approximate)   History   Joint Swelling   HPI  Stuart Mclaughlin is a 37 y.o. male with a past medical history of type 2 diabetes, obesity, hypertension who presents today for evaluation of right elbow pain.  Patient denies any injury or trauma to the area.  He reports that he sleeps with his elbow propped up and thinks that this may have been exacerbating action.  He denies significant discomfort.  He reports that he still able to move his elbow fully.  He has not noticed any skin color changes or warmth to the area.  No fevers or chills.  No drainage.  Patient Active Problem List   Diagnosis Date Noted   Obesity (BMI 30-39.9) 04/01/2022   Pseudohyponatremia 04/01/2022   Hypokalemia 04/01/2022   AKI (acute kidney injury) (HCC) 03/31/2022   Type 2 diabetes mellitus with hyperglycemia (HCC) 03/31/2022   Hypertension associated with diabetes (HCC) 03/31/2022          Physical Exam   Triage Vital Signs: ED Triage Vitals  Enc Vitals Group     BP 07/28/22 1441 (!) 138/102     Pulse Rate 07/28/22 1441 78     Resp 07/28/22 1441 17     Temp --      Temp Source 07/28/22 1441 Oral     SpO2 07/28/22 1441 98 %     Weight 07/28/22 1440 272 lb (123.4 kg)     Height --      Head Circumference --      Peak Flow --      Pain Score 07/28/22 1440 6     Pain Loc --      Pain Edu? --      Excl. in GC? --     Most recent vital signs: Vitals:   07/28/22 1441 07/28/22 1537  BP: (!) 138/102 (!) 130/95  Pulse: 78 70  Resp: 17 18  SpO2: 98% 98%    Physical Exam Vitals and nursing note reviewed.  Constitutional:      General: Awake and alert. No acute distress.    Appearance: Normal appearance. The patient is normal weight.  HENT:     Head: Normocephalic and atraumatic.     Mouth: Mucous membranes are moist.  Eyes:     General: PERRL. Normal  EOMs        Right eye: No discharge.        Left eye: No discharge.     Conjunctiva/sclera: Conjunctivae normal.  Cardiovascular:     Rate and Rhythm: Normal rate and regular rhythm.     Pulses: Normal pulses.  Pulmonary:     Effort: Pulmonary effort is normal. No respiratory distress.     Breath sounds: Normal breath sounds.  Abdominal:     Abdomen is soft. There is no abdominal tenderness. No rebound or guarding. No distention. Musculoskeletal:        General: No swelling. Normal range of motion.     Cervical back: Normal range of motion and neck supple.  Right elbow: elbow with fluctuance overlying the olecranon. Patient is able to flex and extend his elbow fully. There is no warmth or erythema noted to the elbow. He has normal supination and pronation against resistance. Normal radial pulse. No skin openings or drainage. No bony tenderness.  Skin:    General: Skin  is warm and dry.     Capillary Refill: Capillary refill takes less than 2 seconds.     Findings: No rash.  Neurological:     Mental Status: The patient is awake and alert.      ED Results / Procedures / Treatments   Labs (all labs ordered are listed, but only abnormal results are displayed) Labs Reviewed - No data to display   EKG     RADIOLOGY I independently reviewed and interpreted imaging and agree with radiologists findings.     PROCEDURES:  Critical Care performed:   Procedures   MEDICATIONS ORDERED IN ED: Medications - No data to display   IMPRESSION / MDM / ASSESSMENT AND PLAN / ED COURSE  I reviewed the triage vital signs and the nursing notes.   Differential diagnosis includes, but is not limited to, bursitis, hematoma, tendinitis, less likely fracture or dislocation.   Patient is awake and alert, hemodynamically stable and neurovascularly intact.  He has full and normal range of motion of his elbow with full extension and flexion, and normal supination and pronation, and no bony  injury on previous imaging.  Symptoms are most consistent with a bursitis of the olecranon.  There is no warmth or erythema to suggest septic bursitis.  There is no joint effusion, fever, and patient has full and normal range of motion of the elbow, I do not suspect septic joint.  He was instructed to provide compression with an Ace wrap, and continue ice and elevation.  He was instructed to follow-up with orthopedics if symptoms persist and he was given the appropriate follow-up information.  We discussed with him approximately importance of close outpatient follow-up.  Patient understands and agrees with plan.  He was discharged in stable condition.   Patient's presentation is most consistent with acute complicated illness / injury requiring diagnostic workup.     FINAL CLINICAL IMPRESSION(S) / ED DIAGNOSES   Final diagnoses:  Olecranon bursitis of right elbow     Rx / DC Orders   ED Discharge Orders     None        Note:  This document was prepared using Dragon voice recognition software and may include unintentional dictation errors.   Keturah Shavers 07/28/22 1806    Trinna Post, MD 07/28/22 [phone]

## 2022-11-06 ENCOUNTER — Other Ambulatory Visit: Payer: Self-pay

## 2022-11-19 ENCOUNTER — Other Ambulatory Visit: Payer: Self-pay

## 2022-11-19 ENCOUNTER — Ambulatory Visit (INDEPENDENT_AMBULATORY_CARE_PROVIDER_SITE_OTHER): Payer: Self-pay | Admitting: Adult Health

## 2022-11-19 ENCOUNTER — Encounter: Payer: Self-pay | Admitting: Adult Health

## 2022-11-19 VITALS — BP 120/84 | HR 83 | Temp 98.0°F | Ht 76.0 in | Wt 270.0 lb

## 2022-11-19 DIAGNOSIS — M25571 Pain in right ankle and joints of right foot: Secondary | ICD-10-CM

## 2022-11-19 NOTE — Progress Notes (Signed)
Therapist[facility] [address] [place], [place] [address]   Office Visit Note  Patient Name: Stuart Mclaughlin Date of Birth [id number]  Medical Record number [id number]  Date of Service: 11/19/2022  Chief Complaint  Patient presents with   Acute Visit    Patient c/o R ankle swelling with pain x 24 hours. Pain worsened this morning. No known injury.     HPI Pt is here for a sick visit. Pt reports he left work 2 days ago and noticed her right ankle was sore.  The pain is getting worse each day since then.  It woke him up last night it was hurting so bad. He broke this ankle about 15 years ago.  He did not need surgery at that time.  He does not recall any trauma that could have caused his present pain.    Current Medication:  Outpatient Encounter Medications as of 11/19/2022  Medication Sig   amLODipine (NORVASC) 5 MG tablet Take 1 tablet (5 mg total) by mouth daily.   Blood Glucose Monitoring Suppl (BLOOD GLUCOSE MONITOR SYSTEM) w/Device KIT Test in the morning, at noon, and at bedtime.   Continuous Glucose Sensor (DEXCOM G7 SENSOR) MISC Use every 10 days   insulin glargine (LANTUS SOLOSTAR) 100 UNIT/ML Solostar Pen Inject 30 Units into the skin daily.   insulin lispro (HUMALOG) 100 UNIT/ML KwikPen Inject 8 Units into the skin 3 (three) times daily before meals.   Insulin Pen Needle (PEN NEEDLES) 32G X 4 MM MISC Use 3 (three) times daily before meals and with basaglar   metFORMIN (GLUCOPHAGE-XR) 750 MG 24 hr tablet Take 1 tablet (750 mg total) by mouth daily with breakfast. For one week and then take two daily, one with breakfast and one with dinner   [DISCONTINUED] hydrochlorothiazide (MICROZIDE) 12.5 MG capsule Take 1 capsule (12.5 mg total) by mouth daily.   [DISCONTINUED] icosapent Ethyl (VASCEPA) 1 g capsule Take 2 capsules (2 g total) by mouth 2 (two) times daily.   No facility-administered encounter medications on file as of 11/19/2022.      Medical History: Past  Medical History:  Diagnosis Date   Allergy    Arthritis    Deep vein thrombosis (DVT) (HCC)    Hypertension      Vital Signs: BP 120/84   Pulse 83   Temp 98 F (36.7 C)   Ht 6\' 4"  (1.93 m)   Wt 270 lb (122.5 kg)   SpO2 96%   BMI 32.87 kg/m    Review of Systems  Musculoskeletal:        Right ankle pain    Physical Exam Vitals reviewed.  Constitutional:      Appearance: Normal appearance.  Musculoskeletal:     Comments: Tenderness over right lateral malleolus and around lateral ankle. No bruising, some mild swelling, no redness or warmth noted.   Neurological:     Mental Status: He is alert.    Assessment/Plan: 1. Acute right ankle pain Discussed R.I.C.E.  Rest, heat 20 minutes on, with at least two hours of off time between treatments.  Use a barrier between skin and heat.  Take Ibuprofen 600-800mg  ever 6-8 hours for inflammation.  Compression with Ace-Wrap or brace.  Keep elevated whenever possible.        General Counseling: Dewon verbalizes understanding of the findings of todays visit and agrees with plan of treatment. I have discussed any further diagnostic evaluation that may be needed or ordered today. We also reviewed  his medications today. he has been encouraged to call the office with any questions or concerns that should arise related to todays visit.   No orders of the defined types were placed in this encounter.   No orders of the defined types were placed in this encounter.   Time spent:15 Minutes    Johnna Acosta AGNP-C Nurse Practitioner

## 2022-12-30 ENCOUNTER — Other Ambulatory Visit: Payer: Self-pay | Admitting: Physician Assistant

## 2022-12-30 DIAGNOSIS — E1159 Type 2 diabetes mellitus with other circulatory complications: Secondary | ICD-10-CM

## 2022-12-30 NOTE — Telephone Encounter (Signed)
Medication Refill -  Most Recent Primary Care Visit:  Provider: [name]  Department: [facility]  Visit Type: OFFICE VISIT  Date: 07/08/2022  Medication: insulin glargine (LANTUS SOLOSTAR) 100 UNIT/ML Solostar Pen, insulin lispro (HUMALOG) 100 UNIT/ML KwikPen and metFORMIN (GLUCOPHAGE-XR) 750 MG 24 hr tablet [   Has the patient contacted their pharmacy? No  Is this the correct pharmacy for this prescription? Yes  This is the patient's preferred pharmacy: [id number] - [address] [place] [address] Phone: [phone] Fax: [phone]  Has the prescription been filled recently? Yes  Is the patient out of the medication? Yes  Has the patient been seen for an appointment in the last year OR does the patient have an upcoming appointment? Yes  Can we respond through MyChart? No  Agent: Please be advised that Rx refills may take up to 3 business days. We ask that you follow-up with your pharmacy.

## 2022-12-31 ENCOUNTER — Ambulatory Visit: Payer: Self-pay | Admitting: *Deleted

## 2022-12-31 ENCOUNTER — Other Ambulatory Visit: Payer: Self-pay | Admitting: Physician Assistant

## 2022-12-31 DIAGNOSIS — I152 Hypertension secondary to endocrine disorders: Secondary | ICD-10-CM

## 2022-12-31 MED ORDER — METFORMIN HCL ER 750 MG PO TB24: 750.0000 mg | ORAL_TABLET | Freq: Every day | ORAL | 0 refills | Status: DC

## 2022-12-31 NOTE — Telephone Encounter (Signed)
Metformin refilled today in a separate refill encounter.

## 2022-12-31 NOTE — Telephone Encounter (Signed)
Requested medication (s) are due for refill today: Yes  Requested medication (s) are on the active medication list: Yes  Last refill:  04/02/22  Future visit scheduled: Yes  Notes to clinic:  Unable to refill per protocol, last refill by another provider.      Requested Prescriptions  Pending Prescriptions Disp Refills   insulin glargine (LANTUS SOLOSTAR) 100 UNIT/ML Solostar Pen 15 mL 0    Sig: Inject 30 Units into the skin daily.     Endocrinology:  Diabetes - Insulins Failed - 12/31/2022  9:03 AM      Failed - HBA1C is between 0 and 7.9 and within 180 days    Hgb A1c MFr Bld  Date Value Ref Range Status  07/08/2022 10.8 (H) 4.8 - 5.6 % Final    Comment:             Prediabetes: 5.7 - 6.4          Diabetes: >6.4          Glycemic control for adults with diabetes: <7.0          Passed - Valid encounter within last 6 months    Recent Outpatient Visits           5 months ago Hypertension associated with diabetes [facility])   [place] Edwards, [place], PA-C   8 months ago Type 2 diabetes mellitus with hyperglycemia, with long-term current use of insulin [facility])   [place] [facility] [name], PA-C   3 years ago Foot pain, left   [place] Bosie Clos, MD   3 years ago Annual physical exam   [place] [facility] Bosie Clos, MD       Future Appointments             In 1 week Debera Lat, PA-C [facility]             insulin lispro (HUMALOG) 100 UNIT/ML KwikPen 15 mL 0    Sig: Inject 8 Units into the skin 3 (three) times daily before meals.     Endocrinology:  Diabetes - Insulins Failed - 12/31/2022  9:03 AM      Failed - HBA1C is between 0 and 7.9 and within 180 days    Hgb A1c MFr Bld  Date Value Ref Range Status  07/08/2022 10.8 (H) 4.8 - 5.6 % Final    Comment:             Prediabetes: 5.7 - 6.4           Diabetes: >6.4          Glycemic control for adults with diabetes: <7.0          Passed - Valid encounter within last 6 months    Recent Outpatient Visits           5 months ago Hypertension associated with diabetes [facility])   [place] Ok Edwards, [place], PA-C   8 months ago Type 2 diabetes mellitus with hyperglycemia, with long-term current use of insulin [facility])   [place] [facility] [name], PA-C   3 years ago Foot pain, left   [place] [facility] Bosie Clos, MD   3 years ago Annual physical exam   [facility] Bosie Clos, MD       Future Appointments  In 1 week Debera Lat, PA-C [facility] [place]

## 2022-12-31 NOTE — Telephone Encounter (Signed)
2nd attempt to contact patient regarding elevated blood sugar. No answer, LVMTCB [phone].

## 2022-12-31 NOTE — Telephone Encounter (Signed)
Requested medications are due for refill today.  yes  Requested medications are on the active medications list.  yes  Last refill. Both refilled 04/02/2022   Future visit scheduled.   yes  Notes to clinic.  Richard [employer] signed rxs.    Requested Prescriptions  Pending Prescriptions Disp Refills   insulin glargine (LANTUS SOLOSTAR) 100 UNIT/ML Solostar Pen 15 mL 0    Sig: Inject 30 Units into the skin daily.     Endocrinology:  Diabetes - Insulins Failed - 12/30/2022  9:53 AM      Failed - HBA1C is between 0 and 7.9 and within 180 days    Hgb A1c MFr Bld  Date Value Ref Range Status  07/08/2022 10.8 (H) 4.8 - 5.6 % Final    Comment:             Prediabetes: 5.7 - 6.4          Diabetes: >6.4          Glycemic control for adults with diabetes: <7.0          Passed - Valid encounter within last 6 months    Recent Outpatient Visits           5 months ago Hypertension associated with diabetes [facility])   [place] [facility] Ok Edwards, [place], PA-C   8 months ago Type 2 diabetes mellitus with hyperglycemia, with long-term current use of insulin [facility])   [place] [facility] [name], PA-C   3 years ago Foot pain, left   [place] [facility] Bosie Clos, MD   3 years ago Annual physical exam   [place] [facility] Bosie Clos, MD       Future Appointments             In 1 week Debera Lat, PA-C [facility]             insulin lispro (HUMALOG) 100 UNIT/ML KwikPen 15 mL 0    Sig: Inject 8 Units into the skin 3 (three) times daily before meals.     Endocrinology:  Diabetes - Insulins Failed - 12/30/2022  9:53 AM      Failed - HBA1C is between 0 and 7.9 and within 180 days    Hgb A1c MFr Bld  Date Value Ref Range Status  07/08/2022 10.8 (H) 4.8 - 5.6 % Final    Comment:             Prediabetes: 5.7 - 6.4          Diabetes: >6.4           Glycemic control for adults with diabetes: <7.0          Passed - Valid encounter within last 6 months    Recent Outpatient Visits           5 months ago Hypertension associated with diabetes [facility])   [place] [facility] Ok Edwards, [place], PA-C   8 months ago Type 2 diabetes mellitus with hyperglycemia, with long-term current use of insulin [facility])   [place] [facility] [name], PA-C   3 years ago Foot pain, left   [facility] Bosie Clos, MD   3 years ago Annual physical exam   [facility] Bosie Clos, MD       Future Appointments  In 1 week Debera Lat, PA-C [facility]            Signed Prescriptions Disp Refills   metFORMIN (GLUCOPHAGE-XR) 750 MG 24 hr tablet 180 tablet 0    Sig: Take 1 tablet (750 mg total) by mouth daily with breakfast. For one week and then take two daily, one with breakfast and one with dinner     Endocrinology:  Diabetes - Biguanides Failed - 12/30/2022  9:53 AM      Failed - HBA1C is between 0 and 7.9 and within 180 days    Hgb A1c MFr Bld  Date Value Ref Range Status  07/08/2022 10.8 (H) 4.8 - 5.6 % Final    Comment:             Prediabetes: 5.7 - 6.4          Diabetes: >6.4          Glycemic control for adults with diabetes: <7.0          Failed - B12 Level in normal range and within 720 days    No results found for: "VITAMINB12"       Failed - CBC within normal limits and completed in the last 12 months    WBC  Date Value Ref Range Status  04/02/2022 5.3 4.0 - 10.5 K/uL Final   RBC  Date Value Ref Range Status  04/02/2022 4.51 4.22 - 5.81 MIL/uL Final   Hemoglobin  Date Value Ref Range Status  04/02/2022 14.2 13.0 - 17.0 g/dL Final  95/28/4132 44.0 13.0 - 17.7 g/dL Final   HCT  Date Value Ref Range Status  04/02/2022 41.0 39.0 - 52.0 % Final   Hematocrit  Date Value  Ref Range Status  03/30/2019 46.7 37.5 - 51.0 % Final   MCHC  Date Value Ref Range Status  04/02/2022 34.6 30.0 - 36.0 g/dL Final   [facility]  Date Value Ref Range Status  04/02/2022 31.5 26.0 - 34.0 pg Final   MCV  Date Value Ref Range Status  04/02/2022 90.9 80.0 - 100.0 fL Final  03/30/2019 94 79 - 97 fL Final  06/07/2011 94 80 - 100 fL Final   No results found for: "PLTCOUNTKUC", "LABPLAT", "POCPLA" RDW  Date Value Ref Range Status  04/02/2022 12.7 11.5 - 15.5 % Final  03/30/2019 13.6 11.6 - 15.4 % Final  06/07/2011 13.4 11.5 - 14.5 % Final         Passed - Cr in normal range and within 360 days    Creatinine  Date Value Ref Range Status  06/07/2011 1.43 (H) 0.60 - 1.30 mg/dL Final   Creatinine, Ser  Date Value Ref Range Status  04/01/2022 0.96 0.61 - 1.24 mg/dL Final         Passed - eGFR in normal range and within 360 days    EGFR (African American)  Date Value Ref Range Status  06/07/2011 >60  Final   GFR calc Af Amer  Date Value Ref Range Status  09/11/2019 >60 >60 mL/min Final   EGFR (Non-African Amer.)  Date Value Ref Range Status  06/07/2011 >60  Final    Comment:    eGFR values <49mL/min/1.73 m2 may be an indication of chronic kidney disease (CKD). Calculated eGFR is useful in patients with stable renal function. The eGFR calculation will not be reliable in acutely ill patients when serum creatinine is changing rapidly. It is not useful in  patients on dialysis. The  eGFR calculation may not be applicable to patients at the low and high extremes of body sizes, pregnant women, and vegetarians.    GFR, Estimated  Date Value Ref Range Status  04/01/2022 >60 >60 mL/min Final    Comment:    (NOTE) Calculated using the CKD-EPI Creatinine Equation (2021)          Passed - Valid encounter within last 6 months    Recent Outpatient Visits           5 months ago Hypertension associated with diabetes [facility])   [place] [facility]  [name], PA-C   8 months ago Type 2 diabetes mellitus with hyperglycemia, with long-term current use of insulin [facility])   [place] [facility] [name], PA-C   3 years ago Foot pain, left   [facility] Bosie Clos, MD   3 years ago Annual physical exam   [facility]       Future Appointments             In 1 week Debera Lat, PA-C [facility]

## 2022-12-31 NOTE — Telephone Encounter (Signed)
Requested Prescriptions  Pending Prescriptions Disp Refills   insulin glargine (LANTUS SOLOSTAR) 100 UNIT/ML Solostar Pen 15 mL 0    Sig: Inject 30 Units into the skin daily.     Endocrinology:  Diabetes - Insulins Failed - 12/30/2022  9:53 AM      Failed - HBA1C is between 0 and 7.9 and within 180 days    Hgb A1c MFr Bld  Date Value Ref Range Status  07/08/2022 10.8 (H) 4.8 - 5.6 % Final    Comment:             Prediabetes: 5.7 - 6.4          Diabetes: >6.4          Glycemic control for adults with diabetes: <7.0          Passed - Valid encounter within last 6 months    Recent Outpatient Visits           5 months ago Hypertension associated with diabetes [facility])   [place] [facility] Ok Edwards, [place], PA-C   8 months ago Type 2 diabetes mellitus with hyperglycemia, with long-term current use of insulin [facility])   [place] [facility] [name], PA-C   3 years ago Foot pain, left   [place] [facility] Bosie Clos, MD   3 years ago Annual physical exam   [facility] Bosie Clos, MD       Future Appointments             In 1 week Debera Lat, PA-C [facility]             insulin lispro (HUMALOG) 100 UNIT/ML KwikPen 15 mL 0    Sig: Inject 8 Units into the skin 3 (three) times daily before meals.     Endocrinology:  Diabetes - Insulins Failed - 12/30/2022  9:53 AM      Failed - HBA1C is between 0 and 7.9 and within 180 days    Hgb A1c MFr Bld  Date Value Ref Range Status  07/08/2022 10.8 (H) 4.8 - 5.6 % Final    Comment:             Prediabetes: 5.7 - 6.4          Diabetes: >6.4          Glycemic control for adults with diabetes: <7.0          Passed - Valid encounter within last 6 months    Recent Outpatient Visits           5 months ago Hypertension associated with diabetes [facility])   [facility]  Ok Edwards, [place], PA-C   8 months ago Type 2 diabetes mellitus with hyperglycemia, with long-term current use of insulin [facility])   [facility] [name], PA-C   3 years ago Foot pain, left   [place] [facility] Bosie Clos, MD   3 years ago Annual physical exam   [place] [facility] Bosie Clos, MD       Future Appointments             In 1 week Debera Lat, PA-C [facility]             metFORMIN (GLUCOPHAGE-XR) 750 MG 24 hr tablet 180 tablet 0    Sig: Take 1 tablet (750 mg  total) by mouth daily with breakfast. For one week and then take two daily, one with breakfast and one with dinner     Endocrinology:  Diabetes - Biguanides Failed - 12/30/2022  9:53 AM      Failed - HBA1C is between 0 and 7.9 and within 180 days    Hgb A1c MFr Bld  Date Value Ref Range Status  07/08/2022 10.8 (H) 4.8 - 5.6 % Final    Comment:             Prediabetes: 5.7 - 6.4          Diabetes: >6.4          Glycemic control for adults with diabetes: <7.0          Failed - B12 Level in normal range and within 720 days    No results found for: "VITAMINB12"       Failed - CBC within normal limits and completed in the last 12 months    WBC  Date Value Ref Range Status  04/02/2022 5.3 4.0 - 10.5 K/uL Final   RBC  Date Value Ref Range Status  04/02/2022 4.51 4.22 - 5.81 MIL/uL Final   Hemoglobin  Date Value Ref Range Status  04/02/2022 14.2 13.0 - 17.0 g/dL Final  25/36/6440 34.7 13.0 - 17.7 g/dL Final   HCT  Date Value Ref Range Status  04/02/2022 41.0 39.0 - 52.0 % Final   Hematocrit  Date Value Ref Range Status  03/30/2019 46.7 37.5 - 51.0 % Final   MCHC  Date Value Ref Range Status  04/02/2022 34.6 30.0 - 36.0 g/dL Final   [facility]  Date Value Ref Range Status  04/02/2022 31.5 26.0 - 34.0 pg Final   MCV  Date Value Ref Range Status  04/02/2022 90.9 80.0 - 100.0 fL  Final  03/30/2019 94 79 - 97 fL Final  06/07/2011 94 80 - 100 fL Final   No results found for: "PLTCOUNTKUC", "LABPLAT", "POCPLA" RDW  Date Value Ref Range Status  04/02/2022 12.7 11.5 - 15.5 % Final  03/30/2019 13.6 11.6 - 15.4 % Final  06/07/2011 13.4 11.5 - 14.5 % Final         Passed - Cr in normal range and within 360 days    Creatinine  Date Value Ref Range Status  06/07/2011 1.43 (H) 0.60 - 1.30 mg/dL Final   Creatinine, Ser  Date Value Ref Range Status  04/01/2022 0.96 0.61 - 1.24 mg/dL Final         Passed - eGFR in normal range and within 360 days    EGFR (African American)  Date Value Ref Range Status  06/07/2011 >60  Final   GFR calc Af Amer  Date Value Ref Range Status  09/11/2019 >60 >60 mL/min Final   EGFR (Non-African Amer.)  Date Value Ref Range Status  06/07/2011 >60  Final    Comment:    eGFR values <22mL/min/1.73 m2 may be an indication of chronic kidney disease (CKD). Calculated eGFR is useful in patients with stable renal function. The eGFR calculation will not be reliable in acutely ill patients when serum creatinine is changing rapidly. It is not useful in  patients on dialysis. The eGFR calculation may not be applicable to patients at the low and high extremes of body sizes, pregnant women, and vegetarians.    GFR, Estimated  Date Value Ref Range Status  04/01/2022 >60 >60 mL/min Final    Comment:    (NOTE) Calculated using  the CKD-EPI Creatinine Equation (2021)          Passed - Valid encounter within last 6 months    Recent Outpatient Visits           5 months ago Hypertension associated with diabetes [facility])   [place] [facility] [name], PA-C   8 months ago Type 2 diabetes mellitus with hyperglycemia, with long-term current use of insulin [facility])   [place] [facility] [name], PA-C   3 years ago Foot pain, left   [facility] Bosie Clos, MD    3 years ago Annual physical exam   [facility] Bosie Clos, MD       Future Appointments             In 1 week Debera Lat, PA-C [facility]

## 2022-12-31 NOTE — Telephone Encounter (Signed)
Medication Refill -  Most Recent Primary Care Visit:  Provider: [name]  Department: [facility]  Visit Type: OFFICE VISIT  Date: 07/08/2022  Medication: insulin glargine (LANTUS SOLOSTAR) 100 UNIT/ML Solostar Pen, metFORMIN (GLUCOPHAGE-XR) 750 MG 24 hr tablet ,insulin lispro (HUMALOG) 100 UNIT/ML KwikPen   Has the patient contacted their pharmacy? Yes  (Agent: If yes, when and what did the pharmacy advise?)  Is this the correct pharmacy for this prescription? Yes If no, delete pharmacy and type the correct one.  This is the patient's preferred pharmacy:   [id number] - [address] [place] [address] Phone: [phone] Fax: [phone]   Has the prescription been filled recently? No  Is the patient out of the medication? Yes  Has the patient been seen for an appointment in the last year OR does the patient have an upcoming appointment? Yes  Can we respond through MyChart? No  Agent: Please be advised that Rx refills may take up to 3 business days. We ask that you follow-up with your pharmacy.

## 2022-12-31 NOTE — Telephone Encounter (Signed)
Patient called, left VM to return the call to the office to speak to the NT. Unable to reach patient after 3 attempts by [facility] NT, routing to the provider for resolution per protocol.   Summary: sugar 300-medication refill   Pt mother stated pt hasn't been feeling well. Sugar has been in the 300's pt is craving sugar and urinating a lot . Pt is out of his insulin.  Pt is currently at work please call him directly; Mom stated he is also having phone issues, so call between 12-1 if possible.  # [phone]  Seeking clinical advice.

## 2022-12-31 NOTE — Telephone Encounter (Signed)
Summary: sugar 300-medication refill   Pt mother stated pt hasn't been feeling well. Sugar has been in the 300's pt is craving sugar and urinating a lot . Pt is out of his insulin.  Pt is currently at work please call him directly; Mom stated he is also having phone issues, so call between 12-1 if possible.  # [phone]  Seeking clinical advice.          Called patient  [phone]  to review sx of blood sugar and medication requests. No answer. LVMTCB [phone].

## 2023-01-06 ENCOUNTER — Other Ambulatory Visit: Payer: Self-pay

## 2023-01-06 ENCOUNTER — Ambulatory Visit (INDEPENDENT_AMBULATORY_CARE_PROVIDER_SITE_OTHER): Payer: Self-pay | Admitting: Physician Assistant

## 2023-01-06 ENCOUNTER — Encounter: Payer: Self-pay | Admitting: Physician Assistant

## 2023-01-06 ENCOUNTER — Ambulatory Visit: Payer: Self-pay

## 2023-01-06 ENCOUNTER — Other Ambulatory Visit: Payer: Self-pay | Admitting: Physician Assistant

## 2023-01-06 VITALS — BP 130/88 | HR 93 | Temp 97.8°F | Ht 76.0 in | Wt 270.0 lb

## 2023-01-06 DIAGNOSIS — E1165 Type 2 diabetes mellitus with hyperglycemia: Secondary | ICD-10-CM

## 2023-01-06 DIAGNOSIS — R0981 Nasal congestion: Secondary | ICD-10-CM

## 2023-01-06 DIAGNOSIS — R739 Hyperglycemia, unspecified: Secondary | ICD-10-CM

## 2023-01-06 DIAGNOSIS — R519 Headache, unspecified: Secondary | ICD-10-CM

## 2023-01-06 LAB — GLUCOSE, POCT (MANUAL RESULT ENTRY): POC Glucose: 361 mg/dL — AB (ref 70–99)

## 2023-01-06 MED ORDER — PEN NEEDLES 32G X 4 MM MISC: 1.0000 | Freq: Three times a day (TID) | 0 refills | Status: AC

## 2023-01-06 MED ORDER — LANTUS SOLOSTAR 100 UNIT/ML ~~LOC~~ SOPN: 30.0000 [IU] | PEN_INJECTOR | Freq: Every day | SUBCUTANEOUS | 0 refills | Status: DC

## 2023-01-06 MED ORDER — INSULIN LISPRO (1 UNIT DIAL) 100 UNIT/ML (KWIKPEN): 8.0000 [IU] | PEN_INJECTOR | Freq: Three times a day (TID) | SUBCUTANEOUS | 0 refills | Status: DC

## 2023-01-06 NOTE — Telephone Encounter (Signed)
Medication Refill -  Most Recent Primary Care Visit:  Provider: [name]  Department: [facility]  Visit Type: OFFICE VISIT  Date: 07/08/2022  Medication:  insulin lispro (HUMALOG) 100 UNIT/ML KwikPen [[id number]  and   insulin glargine (LANTUS SOLOSTAR) 100 UNIT/ML Solostar Pen [[id number]  Has the patient contacted their pharmacy? No    Is this the correct pharmacy for this prescription? Yes If no, delete pharmacy and type the correct one.  This is the patient's preferred pharmacy:  [id number] - [address] [place] [address] Phone: [phone] Fax: [phone]     Has the prescription been filled recently? Yes  Is the patient out of the medication? Yes  Has the patient been seen for an appointment in the last year OR does the patient have an upcoming appointment? Yes  Can we respond through MyChart? No  Agent: Please be advised that Rx refills may take up to 3 business days. We ask that you follow-up with your pharmacy.

## 2023-01-06 NOTE — Telephone Encounter (Signed)
  Chief Complaint: Elevated glucose levels- out of insulin Symptoms: high glucose reading, headache, dizziness Frequency: out of medication 3 days  Disposition: [] ED /[] Urgent Care (no appt availability in office) / [] Appointment(In office/virtual)/ []  [facility]/ [] Home Care/ [] Refused Recommended Disposition /[] [facility]/ [x]  Follow-up with PCP Additional Notes: Call to office- unable to refill Rx- outside provider and former provider Rx- office is going to fill for patient- he has been informed courtesy RF- keep appointment on Friday- if levels do not go down as expected UC/ED, increase fluid intake.   Reason for Disposition  [1] Blood glucose > 300 mg/dL (40.1 mmol/L) AND [0] two or more times in a row  [1] Blood glucose 240 - 300 mg/dL (27.2 - 53.6 mmol/L) AND [2] uses insulin (e.g., insulin-dependent, all people with type 1 diabetes)  Answer Assessment - Initial Assessment Questions 1. BLOOD GLUCOSE: "What is your blood glucose level?"      361- patient has been without medication for 3 days 2. ONSET: "When did you check the blood glucose?"     15 minutes ago 3. USUAL RANGE: "What is your glucose level usually?" (e.g., usual fasting morning value, usual evening value)     Low 100's 4. KETONES: "Do you check for ketones (urine or blood test strips)?" If Yes, ask: "What does the test show now?"      na 5. TYPE 1 or 2:  "Do you know what type of diabetes you have?"  (e.g., Type 1, Type 2, Gestational; doesn't know)      Type 2 6. INSULIN: "Do you take insulin?" "What type of insulin(s) do you use? What is the mode of delivery? (syringe, pen; injection or pump)?"      Lantuss, Humalog 7. DIABETES PILLS: "Do you take any pills for your diabetes?" If Yes, ask: "Have you missed taking any pills recently?"     Metformin- not out of that 8. OTHER SYMPTOMS: "Do you have any symptoms?" (e.g., fever, frequent urination, difficulty breathing, dizziness, weakness,  vomiting)     Headache ,dizziness  Protocols used: Diabetes - High Blood Sugar-A-AH

## 2023-01-06 NOTE — Telephone Encounter (Signed)
Patient called, left VM to return the call to the office to speak to the NT.    Summary: high Blood sugar & out of medication + dizziness + headache   Patient has called stating his blood sugar is running 300 & he has been having a little bit of headache + dizziness, but states not too bad. Patient is out of medication below.  insulin glargine (LANTUS SOLOSTAR) 100 UNIT/ML Solostar Pen  insulin lispro (HUMALOG) 100 UNIT/ML KwikPen   [id number] [place], [place] - [address] Phone: [phone] Fax: [phone]   Please advise. The call got disconnected before I could confirm phone number however he called from [phone] Attempted to reach patient back and got his voicemail.

## 2023-01-06 NOTE — Progress Notes (Addendum)
[facility] [address] [place], [place] [address]   Office Visit Note  Patient Name: Stuart Mclaughlin Date of Birth [id number]  Medical Record number [id number]  Date of Service: 01/06/2023  Chief Complaint  Patient presents with   Headache    Patient c/o headache, blurred vision, and dizziness. Symptoms began about 2 days ago. "I feel like my sugar is high." Patient does have a hx of DMII.     37 y/o diabetic M presents to the clinic for c/o headache and dizziness x 2 days. Pt states he takes Lantus, Humalog, and Metformin for diabetes control. He tells me that he has out of Lantus and Humalog for 3 days. His headache started the day after. He also c/o excessive thirst. He ate about 2 hours ago. He denies nausea, vomiting, photophobia. No head or neck injuries recently.   Headache  Associated symptoms include dizziness. Pertinent negatives include no weakness.      Current Medication:  Outpatient Encounter Medications as of 01/06/2023  Medication Sig   amLODipine (NORVASC) 5 MG tablet Take 1 tablet (5 mg total) by mouth daily.   Blood Glucose Monitoring Suppl (BLOOD GLUCOSE MONITOR SYSTEM) w/Device KIT Test in the morning, at noon, and at bedtime.   Continuous Glucose Sensor (DEXCOM G7 SENSOR) MISC Use every 10 days   metFORMIN (GLUCOPHAGE-XR) 750 MG 24 hr tablet Take 1 tablet (750 mg total) by mouth daily with breakfast. For one week and then take two daily, one with breakfast and one with dinner   [DISCONTINUED] insulin glargine (LANTUS SOLOSTAR) 100 UNIT/ML Solostar Pen Inject 30 Units into the skin daily.   [DISCONTINUED] insulin lispro (HUMALOG) 100 UNIT/ML KwikPen Inject 8 Units into the skin 3 (three) times daily before meals.   [DISCONTINUED] Insulin Pen Needle (PEN NEEDLES) 32G X 4 MM MISC Use 3 (three) times daily before meals and with basaglar   No facility-administered encounter medications on file as of 01/06/2023.      Medical History: Past  Medical History:  Diagnosis Date   Allergy    Arthritis    Deep vein thrombosis (DVT) (HCC)    Hypertension      Vital Signs: BP 130/88   Pulse 93   Temp 97.8 F (36.6 C)   Ht 6\' 4"  (1.93 m)   Wt 270 lb (122.5 kg)   SpO2 95%   BMI 32.87 kg/m    Review of Systems  Constitutional: Negative.   HENT:  Positive for congestion (nasal).   Respiratory: Negative.    Cardiovascular: Negative.   Gastrointestinal: Negative.   Endocrine: Positive for polydipsia.  Neurological:  Positive for dizziness and headaches. Negative for speech difficulty and weakness.    Physical Exam Constitutional:      Appearance: Normal appearance. He is well-developed.  HENT:     Head: Normocephalic and atraumatic.     Right Ear: External ear normal.     Left Ear: External ear normal.     Nose: Mucosal edema and rhinorrhea present. Rhinorrhea is clear.     Right Turbinates: Enlarged.     Left Turbinates: Enlarged.  Eyes:     Extraocular Movements: Extraocular movements intact.  Cardiovascular:     Rate and Rhythm: Normal rate and regular rhythm.  Pulmonary:     Effort: Pulmonary effort is normal.     Breath sounds: Normal breath sounds.  Skin:    General: Skin is warm and dry.  Neurological:     Mental Status: He  is alert and oriented to person, place, and time.     Motor: No weakness.     Gait: Gait normal.  Psychiatric:        Mood and Affect: Mood normal.        Behavior: Behavior normal.       Assessment/Plan:  1. Uncontrolled type 2 diabetes mellitus with hyperglycemia (HCC)  2. Elevated blood sugar level  3. Headache disorder - POCT Glucose (CBG)  4. Nasal congestion  I reviewed elevated BS level with him today. 361 Pt hasn't been taking his Lantus or Humalog for the past 3 days because he finished it and doesn't have refills. He called his PCP while at the clinic today to have his Lantus and Humalog refilled.  Pt feels well enough to go to pharmacy to fill his Rx and  start medicine as previously prescribed. Follow up with PCP as scheduled in 2 days or RTC sooner if any difficulty arises. He was given information for [facility] for diabetic supplies and diet. PT will go to ER if his symptoms worsen. Pt verbalized understanding and in agreement.  For nasal congestion start Afrin as directed on the box. Nasal lavage with Neti pot Long hot showers and the steam from the shower will reduce nasal congestion. Increase fluids. Pt verbalized understanding and in agreement.   General Counseling: Kennieth verbalizes understanding of the findings of todays visit and agrees with plan of treatment. I have discussed any further diagnostic evaluation that may be needed or ordered today. We also reviewed his medications today. he has been encouraged to call the office with any questions or concerns that should arise related to todays visit.    Time spent:30 Minutes    Gilberto Better, [place] Physician Assistant

## 2023-01-07 ENCOUNTER — Telehealth: Payer: Self-pay | Admitting: Physician Assistant

## 2023-01-07 NOTE — Telephone Encounter (Signed)
Received fax from cover my meds for Insulin Glargine-fygn 100 unit/ml  Key:  [id number]

## 2023-01-07 NOTE — Telephone Encounter (Signed)
Requested medication (s) are due for refill today: No  Requested medication (s) are on the active medication list: Yes  Last refill:  01/06/23  Future visit scheduled: Yes  Notes to clinic:  Refilled 01/06/23.    Requested Prescriptions  Pending Prescriptions Disp Refills   insulin lispro (HUMALOG) 100 UNIT/ML KwikPen 3 mL 0    Sig: Inject 8 Units into the skin 3 (three) times daily before meals.     Endocrinology:  Diabetes - Insulins Failed - 01/06/2023  5:48 PM      Failed - HBA1C is between 0 and 7.9 and within 180 days    Hgb A1c MFr Bld  Date Value Ref Range Status  07/08/2022 10.8 (H) 4.8 - 5.6 % Final    Comment:             Prediabetes: 5.7 - 6.4          Diabetes: >6.4          Glycemic control for adults with diabetes: <7.0          Failed - Valid encounter within last 6 months    Recent Outpatient Visits           6 months ago Hypertension associated with diabetes [facility])   [place] [name], PA-C   9 months ago Type 2 diabetes mellitus with hyperglycemia, with long-term current use of insulin [facility])   [facility] [facility] [name], PA-C   3 years ago Foot pain, left   [facility] [facility] Bosie Clos, MD   3 years ago Annual physical exam   [place] [facility] Bosie Clos, MD       Future Appointments             Tomorrow Malva Limes, MD [facility]             insulin glargine (LANTUS SOLOSTAR) 100 UNIT/ML Solostar Pen 3 mL 0    Sig: Inject 30 Units into the skin daily.     Endocrinology:  Diabetes - Insulins Failed - 01/06/2023  5:48 PM      Failed - HBA1C is between 0 and 7.9 and within 180 days    Hgb A1c MFr Bld  Date Value Ref Range Status  07/08/2022 10.8 (H) 4.8 - 5.6 % Final    Comment:             Prediabetes: 5.7 - 6.4          Diabetes: >6.4          Glycemic control for adults  with diabetes: <7.0          Failed - Valid encounter within last 6 months    Recent Outpatient Visits           6 months ago Hypertension associated with diabetes [facility])   [facility] [name], PA-C   9 months ago Type 2 diabetes mellitus with hyperglycemia, with long-term current use of insulin [facility])   [place] [facility] [name], PA-C   3 years ago Foot pain, left   [place] [facility] Bosie Clos, MD   3 years ago Annual physical exam   [facility] Bosie Clos, MD       Future Appointments             Tomorrow Malva Limes,  MD [facility], [facility]

## 2023-01-07 NOTE — Telephone Encounter (Signed)
Covermymeds is requesting prescription refill Key:[id number] Insulin Linspro (1 unit dial) 100 unit/ml has been rejected and requires PA

## 2023-01-08 ENCOUNTER — Ambulatory Visit: Payer: BC Managed Care – PPO | Admitting: Family Medicine

## 2023-01-08 ENCOUNTER — Ambulatory Visit: Payer: BC Managed Care – PPO | Admitting: Physician Assistant

## 2023-01-08 ENCOUNTER — Encounter: Payer: Self-pay | Admitting: Family Medicine

## 2023-01-08 VITALS — BP 148/98 | HR 79 | Ht 76.0 in | Wt 273.0 lb

## 2023-01-08 DIAGNOSIS — Z794 Long term (current) use of insulin: Secondary | ICD-10-CM | POA: Diagnosis not present

## 2023-01-08 DIAGNOSIS — E1165 Type 2 diabetes mellitus with hyperglycemia: Secondary | ICD-10-CM

## 2023-01-08 DIAGNOSIS — E781 Pure hyperglyceridemia: Secondary | ICD-10-CM | POA: Diagnosis not present

## 2023-01-08 DIAGNOSIS — I152 Hypertension secondary to endocrine disorders: Secondary | ICD-10-CM

## 2023-01-08 DIAGNOSIS — E1159 Type 2 diabetes mellitus with other circulatory complications: Secondary | ICD-10-CM | POA: Diagnosis not present

## 2023-01-08 MED ORDER — LANTUS SOLOSTAR 100 UNIT/ML ~~LOC~~ SOPN: 30.0000 [IU] | PEN_INJECTOR | Freq: Every day | SUBCUTANEOUS | 0 refills | Status: DC

## 2023-01-08 MED ORDER — INSULIN LISPRO (1 UNIT DIAL) 100 UNIT/ML (KWIKPEN): 8.0000 [IU] | PEN_INJECTOR | Freq: Three times a day (TID) | SUBCUTANEOUS | 0 refills | Status: DC

## 2023-01-08 NOTE — Progress Notes (Signed)
Established patient visit   Patient: Stuart Mclaughlin   DOB: 06-02-1985   37 y.o. Male  MRN: [id number] Visit Date: 01/08/2023  Today's healthcare provider: Mila Merry, MD   Chief Complaint  Patient presents with   Medication Refill    Patient reports needing refills on his insulin   Hypertension    Does not monitor at home   Diabetes    Foot Exam due    Subjective    Discussed the use of AI scribe software for clinical note transcription with the patient, who gave verbal consent to proceed.  History of Present Illness   The patient, known to have diabetes, hyperlipidemia, and hypertension, presented for a routine check-up. He reported running out of his insulin medication three days prior to the visit, which he had been taking at a dose of eight units three times a day and thirty units once a day. He also mentioned having a continuous monitor to keep track of his blood sugars.  Regarding his hypertension, the patient confirmed taking amlodipine daily, but admitted to missing doses recently. He denied experiencing any chest pains, heart flutters, or trouble breathing. He also denied any swelling in his hands, feet, or ankles.  The patient reported having sufficient supply of his blood pressure medication and denied needing a refill at the time of the visit. He also mentioned having recently filled most of his other prescriptions.       Medications: Outpatient Medications Prior to Visit  Medication Sig   amLODipine (NORVASC) 5 MG tablet Take 1 tablet (5 mg total) by mouth daily.   Blood Glucose Monitoring Suppl (BLOOD GLUCOSE MONITOR SYSTEM) w/Device KIT Test in the morning, at noon, and at bedtime.   Continuous Glucose Sensor (DEXCOM G7 SENSOR) MISC Use every 10 days   Insulin Pen Needle (PEN NEEDLES) 32G X 4 MM MISC Use 3 (three) times daily before meals and with basaglar   metFORMIN (GLUCOPHAGE-XR) 750 MG 24 hr tablet Take 1 tablet (750 mg total) by mouth daily  with breakfast. For one week and then take two daily, one with breakfast and one with dinner   [DISCONTINUED] insulin glargine (LANTUS SOLOSTAR) 100 UNIT/ML Solostar Pen Inject 30 Units into the skin daily.   [DISCONTINUED] insulin lispro (HUMALOG) 100 UNIT/ML KwikPen Inject 8 Units into the skin 3 (three) times daily before meals.   No facility-administered medications prior to visit.   Review of Systems     Objective    BP (!) 148/98   Pulse 79   Ht 6\' 4"  (1.93 m)   Wt 273 lb (123.8 kg)   SpO2 98%   BMI 33.23 kg/m   Physical Exam   General: Appearance:    Mildly obese male in no acute distress  Eyes:    PERRL, conjunctiva/corneas clear, EOM's intact       Lungs:     Clear to auscultation bilaterally, respirations unlabored  Heart:    Normal heart rate. Normal rhythm. No murmurs, rubs, or gallops.    MS:   All extremities are intact.    Neurologic:   Awake, alert, oriented x 3. No apparent focal neurological defect.         Assessment & Plan        Diabetes Mellitus Patient ran out of insulin for the past three days. He was previously on 8 units TID and 30 units once daily. He has a continuous glucose monitor. -Refill insulin prescription. -Continue monitoring blood glucose  levels with continuous glucose monitor.  Hypertension Patient has been inconsistent with amlodipine use. Blood pressure is higher than desired. -Continue amlodipine daily. -Encourage consistent medication use.  Hyperlipidemia Last cholesterol level was elevated. -Plan to check cholesterol level next week.  General Health Maintenance -Plan for patient to have blood drawn next week for cholesterol and hemoglobin levels.    No follow-ups on file.      Mila Merry, MD  [facility] [phone] (phone) [phone] (fax)  [facility]

## 2023-01-08 NOTE — Telephone Encounter (Signed)
Waiting on provider clinic note to be done and need to know if patient fail Novolog?

## 2023-01-08 NOTE — Telephone Encounter (Signed)
PA initiated

## 2023-01-11 ENCOUNTER — Telehealth: Payer: Self-pay | Admitting: Physician Assistant

## 2023-01-11 DIAGNOSIS — E1165 Type 2 diabetes mellitus with hyperglycemia: Secondary | ICD-10-CM

## 2023-01-11 MED ORDER — INSULIN ASPART 100 UNIT/ML IJ SOLN: INTRAMUSCULAR | 1 refills | Status: DC

## 2023-01-11 MED ORDER — NOVOLOG FLEXPEN 100 UNIT/ML ~~LOC~~ SOPN: PEN_INJECTOR | SUBCUTANEOUS | 1 refills | Status: AC

## 2023-01-11 NOTE — Telephone Encounter (Signed)
Covermymeds is requesting prior authorization Key: [id number] Lantus SoloStar 100 UNIT/ML pen injectors has been rejected and requires PA

## 2023-01-11 NOTE — Telephone Encounter (Signed)
Covermymeds is requesting prior authorization Key: [id number] Insulin Aspart 100 unit.ml solution has been rejected and requires PA

## 2023-01-11 NOTE — Telephone Encounter (Signed)
As far as I can tell, he has never been prescribed Novolog. I can send in prescription if it's preferred, please let patient know of The change, but he takes it the same wasy as Humalog.

## 2023-01-11 NOTE — Telephone Encounter (Signed)
PA initiated

## 2023-01-11 NOTE — Telephone Encounter (Signed)
Patient advised.

## 2023-01-11 NOTE — Telephone Encounter (Signed)
Different Key for same medication

## 2023-01-12 NOTE — Telephone Encounter (Signed)
Outcome Denied today by [facility] [place] [facility] 2017 Denied Drug Lantus SoloStar 100UNIT/ML pen-injectors

## 2023-01-12 NOTE — Telephone Encounter (Signed)
PA initiated

## 2023-01-12 NOTE — Telephone Encounter (Signed)
Another prescription was sent in in the preferred formulary

## 2023-01-12 NOTE — Telephone Encounter (Signed)
Paper work send in

## 2023-01-13 NOTE — Telephone Encounter (Signed)
Outcome Denied today by [facility] [place] [facility] 2017 Denied. The request for coverage of Insulin Aspart is denied. This medication is approved when a member has a documented medical reason that they cannot take one of the preferred insulin products (Humulin, Brand Humalog, Lyumjev, Novolin, Brand Novolog). This includes side effects or other reasons that one of the preferred insulin products may cause harm to the member. In this case, the member can be treated with a preferred insulin product. Alternative medications include: Humulin, Brand Humalog, Lyumjev, Novolin, Brand Novolog Utilization management guidelines used: [employer] [place] Short Acting Insulins - [place] Standard Utilization Management Criteria. Drug Insulin Aspart 100UNIT/ML solution

## 2023-01-21 MED ORDER — NOVOLOG FLEXPEN 100 UNIT/ML ~~LOC~~ SOPN: 8.0000 [IU] | PEN_INJECTOR | Freq: Three times a day (TID) | SUBCUTANEOUS | 11 refills | Status: AC

## 2023-01-21 NOTE — Addendum Note (Signed)
Addended by: Debera Lat on: 01/21/2023 09:58 AM   Modules accepted: Orders

## 2023-04-04 ENCOUNTER — Other Ambulatory Visit: Payer: Self-pay | Admitting: Physician Assistant

## 2023-04-04 DIAGNOSIS — E1159 Type 2 diabetes mellitus with other circulatory complications: Secondary | ICD-10-CM

## 2023-04-05 ENCOUNTER — Telehealth: Payer: Self-pay | Admitting: Physician Assistant

## 2023-04-05 NOTE — Telephone Encounter (Signed)
 Requested Prescriptions  Pending Prescriptions Disp Refills   metFORMIN (GLUCOPHAGE-XR) 750 MG 24 hr tablet [Pharmacy Med Name: METFORMIN HCL ER 750 MG TABLET] 180 tablet 0    Sig: TAKE 1 TABLET (750 MG TOTAL) BY MOUTH DAILY WITH BREAKFAST. FOR ONE WEEK AND THEN TAKE TWO DAILY, ONE WITH BREAKFAST AND ONE WITH DINNER     Endocrinology:  Diabetes - Biguanides Failed - 04/05/2023  2:58 PM      Failed - Cr in normal range and within 360 days    Creatinine  Date Value Ref Range Status  06/07/2011 1.43 (H) 0.60 - 1.30 mg/dL Final   Creatinine, Ser  Date Value Ref Range Status  04/01/2022 0.96 0.61 - 1.24 mg/dL Final         Failed - HBA1C is between 0 and 7.9 and within 180 days    Hgb A1c MFr Bld  Date Value Ref Range Status  07/08/2022 10.8 (H) 4.8 - 5.6 % Final    Comment:             Prediabetes: 5.7 - 6.4          Diabetes: >6.4          Glycemic control for adults with diabetes: <7.0          Failed - eGFR in normal range and within 360 days    EGFR (African American)  Date Value Ref Range Status  06/07/2011 >60  Final   GFR calc Af Amer  Date Value Ref Range Status  09/11/2019 >60 >60 mL/min Final   EGFR (Non-African Amer.)  Date Value Ref Range Status  06/07/2011 >60  Final    Comment:    eGFR values <41mL/min/1.73 m2 may be an indication of chronic kidney disease (CKD). Calculated eGFR is useful in patients with stable renal function. The eGFR calculation will not be reliable in acutely ill patients when serum creatinine is changing rapidly. It is not useful in  patients on dialysis. The eGFR calculation may not be applicable to patients at the low and high extremes of body sizes, pregnant women, and vegetarians.    GFR, Estimated  Date Value Ref Range Status  04/01/2022 >60 >60 mL/min Final    Comment:    (NOTE) Calculated using the CKD-EPI Creatinine Equation (2021)          Failed - B12 Level in normal range and within 720 days    No results found  for: "VITAMINB12"       Failed - CBC within normal limits and completed in the last 12 months    WBC  Date Value Ref Range Status  04/02/2022 5.3 4.0 - 10.5 K/uL Final   RBC  Date Value Ref Range Status  04/02/2022 4.51 4.22 - 5.81 MIL/uL Final   Hemoglobin  Date Value Ref Range Status  04/02/2022 14.2 13.0 - 17.0 g/dL Final  78/29/5621 30.8 13.0 - 17.7 g/dL Final   HCT  Date Value Ref Range Status  04/02/2022 41.0 39.0 - 52.0 % Final   Hematocrit  Date Value Ref Range Status  03/30/2019 46.7 37.5 - 51.0 % Final   MCHC  Date Value Ref Range Status  04/02/2022 34.6 30.0 - 36.0 g/dL Final   [facility]  Date Value Ref Range Status  04/02/2022 31.5 26.0 - 34.0 pg Final   MCV  Date Value Ref Range Status  04/02/2022 90.9 80.0 - 100.0 fL Final  03/30/2019 94 79 - 97 fL Final  06/07/2011 94 80 -  100 fL Final   No results found for: "PLTCOUNTKUC", "LABPLAT", "POCPLA" RDW  Date Value Ref Range Status  04/02/2022 12.7 11.5 - 15.5 % Final  03/30/2019 13.6 11.6 - 15.4 % Final  06/07/2011 13.4 11.5 - 14.5 % Final         Passed - Valid encounter within last 6 months    Recent Outpatient Visits           2 months ago Type 2 diabetes mellitus with hyperglycemia, with long-term current use of insulin (HCC)   [place] [facility] Malva Limes, MD   9 months ago Hypertension associated with diabetes [facility])   [place] [facility] [name], PA-C   12 months ago Type 2 diabetes mellitus with hyperglycemia, with long-term current use of insulin [facility])   [place] [facility] [name], PA-C   4 years ago Foot pain, left   [facility] Maple Hudson., MD   4 years ago Annual physical exam   [facility] Maple Hudson., MD       Future Appointments             Tomorrow Debera Lat, PA-C [facility], [facility]

## 2023-04-05 NOTE — Telephone Encounter (Signed)
 Received a fax from covermymeds for Vascepa 1mg   Key:  [id number]

## 2023-04-06 ENCOUNTER — Ambulatory Visit: Payer: BC Managed Care – PPO | Admitting: Physician Assistant

## 2023-04-06 ENCOUNTER — Encounter: Payer: Self-pay | Admitting: Physician Assistant

## 2023-04-06 VITALS — BP 123/84 | HR 96 | Ht 76.0 in | Wt 258.0 lb

## 2023-04-06 DIAGNOSIS — E1165 Type 2 diabetes mellitus with hyperglycemia: Secondary | ICD-10-CM

## 2023-04-06 DIAGNOSIS — Z0001 Encounter for general adult medical examination with abnormal findings: Secondary | ICD-10-CM

## 2023-04-06 DIAGNOSIS — Z794 Long term (current) use of insulin: Secondary | ICD-10-CM

## 2023-04-06 DIAGNOSIS — K5900 Constipation, unspecified: Secondary | ICD-10-CM

## 2023-04-06 DIAGNOSIS — Z0000 Encounter for general adult medical examination without abnormal findings: Secondary | ICD-10-CM

## 2023-04-06 DIAGNOSIS — J302 Other seasonal allergic rhinitis: Secondary | ICD-10-CM

## 2023-04-06 NOTE — Progress Notes (Signed)
 Complete physical exam  Patient: Stuart Mclaughlin   DOB: 1985-03-02   38 y.o. Male  MRN: [id number] Visit Date: 04/06/2023  Today's healthcare provider: Debera Lat, PA-C   Chief Complaint  Patient presents with   Annual Exam    Physical exam , pt has no other concerns    Subjective    Stuart Mclaughlin is a 38 y.o. male who presents today for a complete physical exam.   Discussed the use of AI scribe software for clinical note transcription with the patient, who gave verbal consent to proceed.  History of Present Illness   The patient, with a history of type 2 diabetes and hypertension, presents for a routine follow-up. He reports that his blood glucose levels have been elevated in the 200s for the past two weeks, attributing this to poor dietary choices and inconsistent medication adherence. He has been taking metformin and insulin for diabetes management. He also reports occasional constipation, which he manages by drinking a lot of water. He has a history of seasonal allergies, which he has been managing with Zyrtec for many years. He also reports a corn on his foot, which he has been managing on his own. He denies any numbness or tingling in his feet. He has not seen an eye doctor recently and has not had a dental check-up in a while.        Last depression screening scores    04/06/2023    3:52 PM 07/08/2022    4:11 PM 04/08/2022    9:14 AM  PHQ 2/9 Scores  PHQ - 2 Score 0 0 2  PHQ- 9 Score  1 7   Last fall risk screening    04/06/2023    3:49 PM  Fall Risk   Falls in the past year? 0  Number falls in past yr: 0  Injury with Fall? 0  Risk for fall due to : No Fall Risks  Follow up Falls prevention discussed;Falls evaluation completed   Last Audit-C alcohol use screening    07/08/2022    4:12 PM  Alcohol Use Disorder Test (AUDIT)  1. How often do you have a drink containing alcohol? 2  2. How many drinks containing alcohol do you have on a typical day  when you are drinking? 1  3. How often do you have six or more drinks on one occasion? 2  AUDIT-C Score 5   A score of 3 or more in women, and 4 or more in men indicates increased risk for alcohol abuse, EXCEPT if all of the points are from question 1   Past Medical History:  Diagnosis Date   Allergy    Arthritis    Deep vein thrombosis (DVT) (HCC)    Hypertension    Past Surgical History:  Procedure Laterality Date   PERIPHERAL VASCULAR CATHETERIZATION  04/01/2015   Procedure: Lower Extremity Venography;  Surgeon: Annice Needy, MD;  Location: [facility];  Service: Cardiovascular;;   PERIPHERAL VASCULAR CATHETERIZATION Right 04/01/2015   Procedure: Lower Extremity Intervention;  Surgeon: Annice Needy, MD;  Location: [facility];  Service: Cardiovascular;  Laterality: Right;   PERIPHERAL VASCULAR CATHETERIZATION N/A 04/01/2015   Procedure: IVC Filter Insertion;  Surgeon: Annice Needy, MD;  Location: [facility];  Service: Cardiovascular;  Laterality: N/A;   Social History   Socioeconomic History   Marital status: Single    Spouse name: Not on file   Number of  children: Not on file   Years of education: Not on file   Highest education level: Not on file  Occupational History   Not on file  Tobacco Use   Smoking status: Former    Types: Cigarettes   Smokeless tobacco: Never   Tobacco comments:    Former smoker 1-2 years; quit 2022  Vaping Use   Vaping status: Never Used  Substance and Sexual Activity   Alcohol use: Yes    Alcohol/week: 2.0 standard drinks of alcohol    Types: 2 Cans of beer per week   Drug use: No   Sexual activity: Not on file  Other Topics Concern   Not on file  Social History Narrative   Not on file   Social Drivers of Health   Financial Resource Strain: Not on file  Food Insecurity: No Food Insecurity (04/01/2022)   Hunger Vital Sign    Worried About Running Out of Food in the Last Year: Never true    Ran Out of Food in  the Last Year: Never true  Transportation Needs: No Transportation Needs (04/01/2022)   PRAPARE - Administrator, Civil Service (Medical): No    Lack of Transportation (Non-Medical): No  Physical Activity: Not on file  Stress: Not on file  Social Connections: Not on file  Intimate Partner Violence: Not At Risk (04/01/2022)   Humiliation, Afraid, Rape, and Kick questionnaire    Fear of Current or Ex-Partner: No    Emotionally Abused: No    Physically Abused: No    Sexually Abused: No   No family status information on file.   History reviewed. No pertinent family history. No Known Allergies  Patient Care Team: Debera Lat, PA-C as PCP - General (Physician Assistant)   Medications: Outpatient Medications Prior to Visit  Medication Sig   amLODipine (NORVASC) 5 MG tablet Take 1 tablet (5 mg total) by mouth daily.   Blood Glucose Monitoring Suppl (BLOOD GLUCOSE MONITOR SYSTEM) w/Device KIT Test in the morning, at noon, and at bedtime.   Continuous Glucose Sensor (DEXCOM G7 SENSOR) MISC Use every 10 days   insulin aspart (NOVOLOG FLEXPEN) 100 UNIT/ML FlexPen Inject 8 Units into the skin 3 (three) times daily before meals.   insulin aspart (NOVOLOG FLEXPEN) 100 UNIT/ML FlexPen Inject 8 Units into the skin 3 (three) times daily with meals.   insulin glargine (LANTUS SOLOSTAR) 100 UNIT/ML Solostar Pen Inject 30 Units into the skin daily.   Insulin Pen Needle (PEN NEEDLES) 32G X 4 MM MISC Use 3 (three) times daily before meals and with basaglar   metFORMIN (GLUCOPHAGE-XR) 750 MG 24 hr tablet TAKE 1 TABLET (750 MG TOTAL) BY MOUTH DAILY WITH BREAKFAST. FOR ONE WEEK AND THEN TAKE TWO DAILY, ONE WITH BREAKFAST AND ONE WITH DINNER   No facility-administered medications prior to visit.    Review of Systems  All other systems reviewed and are negative.  Except see HPI     Objective    BP 123/84   Pulse 96   Ht 6\' 4"  (1.93 m)   Wt 258 lb (117 kg)   SpO2 96%   BMI 31.40  kg/m      Physical Exam Vitals reviewed.  Constitutional:      General: He is not in acute distress.    Appearance: Normal appearance. He is well-developed. He is obese. He is not ill-appearing, toxic-appearing or diaphoretic.  HENT:     Head: Normocephalic and atraumatic.     Right  Ear: Tympanic membrane, ear canal and external ear normal.     Left Ear: Tympanic membrane, ear canal and external ear normal.     Nose: Congestion and rhinorrhea present.     Mouth/Throat:     Mouth: Mucous membranes are moist.     Pharynx: Oropharynx is clear. No oropharyngeal exudate.     Comments: Postnasal drainage noted Eyes:     General: No scleral icterus.       Right eye: No discharge.        Left eye: No discharge.     Conjunctiva/sclera: Conjunctivae normal.     Pupils: Pupils are equal, round, and reactive to light.  Neck:     Thyroid: No thyromegaly.     Vascular: No carotid bruit.  Cardiovascular:     Rate and Rhythm: Normal rate and regular rhythm.     Pulses: Normal pulses.          Dorsalis pedis pulses are 2+ on the right side and 2+ on the left side.       Posterior tibial pulses are 2+ on the right side and 2+ on the left side.     Heart sounds: Normal heart sounds. No murmur heard.    No friction rub. No gallop.  Pulmonary:     Effort: Pulmonary effort is normal. No respiratory distress.     Breath sounds: Normal breath sounds. No wheezing or rales.  Abdominal:     General: Abdomen is flat. Bowel sounds are normal. There is no distension.     Palpations: Abdomen is soft. There is no mass.     Tenderness: There is no abdominal tenderness. There is no right CVA tenderness, left CVA tenderness, guarding or rebound.     Hernia: No hernia is present.  Musculoskeletal:        General: No swelling, tenderness, deformity or signs of injury. Normal range of motion.     Cervical back: Normal range of motion and neck supple. No rigidity or tenderness.     Right lower leg: No edema.      Left lower leg: No edema.     Right foot: Normal range of motion. No deformity or foot drop.     Left foot: Normal range of motion. Bunion present. No deformity or foot drop.  Feet:     Right foot:     Protective Sensation: 8 sites tested.  8 sites sensed.     Skin integrity: Skin integrity normal.     Toenail Condition: Right toenails are normal.     Left foot:     Protective Sensation: 8 sites tested.  8 sites sensed.     Skin integrity: Skin integrity normal.     Toenail Condition: Left toenails are normal.  Lymphadenopathy:     Cervical: No cervical adenopathy.  Skin:    General: Skin is warm and dry.     Coloration: Skin is not jaundiced or pale.     Findings: No bruising, erythema, lesion or rash.  Neurological:     Mental Status: He is alert and oriented to person, place, and time. Mental status is at baseline.     Gait: Gait normal.  Psychiatric:        Mood and Affect: Mood normal.        Behavior: Behavior normal.        Thought Content: Thought content normal.        Judgment: Judgment normal.      No results  found for any visits on 04/06/23.  Assessment & Plan    Routine Health Maintenance and Physical Exam  Exercise Activities and Dietary recommendations  Goals   None     Immunization History  Administered Date(s) Administered   Tdap 11/11/2017    Health Maintenance  Topic Date Due   Complete foot exam   Never done   Eye exam for diabetics  Never done   Hemoglobin A1C  01/08/2023   Yearly kidney function blood test for diabetes  04/02/2023   Yearly kidney health urinalysis for diabetes  04/09/2023   COVID-19 Vaccine (1 - 2024-25 season) 04/22/2023*   Flu Shot  05/17/2023*   Pneumococcal Vaccination (1 of 2 - PCV) 04/05/2024*   DTaP/Tdap/Td vaccine (2 - Td or Tdap) 11/12/2027   Hepatitis C Screening  Completed   HIV Screening  Completed   HPV Vaccine  Aged Out  *Topic was postponed. The date shown is not the original due date.     Discussed health benefits of physical activity, and encouraged him to engage in regular exercise appropriate for his age and condition.  Assessment and Plan    1. Annual physical exam (Primary) Needs annual dental/eye exams Things to do to keep yourself healthy  - Exercise at least 30-45 minutes a day, 3-4 days a week.  - Eat a low-fat diet with lots of fruits and vegetables, up to 7-9 servings per day.  - Seatbelts can save your life. Wear them always.  - Smoke detectors on every level of your home, check batteries every year.  - Eye Doctor - have an eye exam every 1-2 years  - Safe sex - if you may be exposed to STDs, use a condom.  - Alcohol -  If you drink, do it moderately, less than 2 drinks per day.  - Health Care Power of Attorney. Choose someone to speak for you if you are not able.  - Depression is common in our stressful world.If you're feeling down or losing interest in things you normally enjoy, please come in for a visit.  - Violence - If anyone is threatening or hurting you, please call immediately.   Type 2 Diabetes Mellitus Recent poor control with blood sugars in the 200s, likely due to poor diet and inconsistent medication adherence. Currently on insulin and metformin. -Encouraged adherence to medication regimen and improved diet. -Return in 6 weeks for follow-up, sooner if blood sugars remain elevated despite adherence to regimen. -Order fasting blood work to assess current control.  Seasonal Allergies Chronic use of Zyrtec with persistent nasal congestion and post-nasal drainage. -Consider changing from Zyrtec due to chronic use.  Constipation Difficulty with bowel movements. -Recommended increased intake of probiotics, high fiber foods, and water. Suggested use of Metamucil if dietary changes are not effective.  General Health Maintenance -Advised annual dental and eye exams due to diabetes. -Advised to monitor for signs of infection, changes in vision,  changes in urine output, and numbness or tingling in feet due to diabetes. -Advised to return for blood work after fasting for 8 hours.      Return in about 6 weeks (around 05/18/2023) for chronic disease f/u.    The patient was advised to call back or seek an in-person evaluation if the symptoms worsen or if the condition fails to improve as anticipated.  I discussed the assessment and treatment plan with the patient. The patient was provided an opportunity to ask questions and all were answered. The patient agreed with the  plan and demonstrated an understanding of the instructions.  I, Debera Lat, PA-C have reviewed all documentation for this visit. The documentation on 04/06/2023  for the exam, diagnosis, procedures, and orders are all accurate and complete.  Debera Lat, [facility], MMS [facility] [phone] (phone) [phone] (fax)  [facility]

## 2023-04-22 DIAGNOSIS — E781 Pure hyperglyceridemia: Secondary | ICD-10-CM | POA: Diagnosis not present

## 2023-04-22 DIAGNOSIS — I152 Hypertension secondary to endocrine disorders: Secondary | ICD-10-CM | POA: Diagnosis not present

## 2023-04-22 DIAGNOSIS — Z794 Long term (current) use of insulin: Secondary | ICD-10-CM | POA: Diagnosis not present

## 2023-04-22 DIAGNOSIS — E1165 Type 2 diabetes mellitus with hyperglycemia: Secondary | ICD-10-CM | POA: Diagnosis not present

## 2023-04-22 DIAGNOSIS — E1159 Type 2 diabetes mellitus with other circulatory complications: Secondary | ICD-10-CM | POA: Diagnosis not present

## 2023-04-23 LAB — COMPREHENSIVE METABOLIC PANEL
ALT: 57 IU/L — ABNORMAL HIGH (ref 0–44)
AST: 31 IU/L (ref 0–40)
Albumin: 4.5 g/dL (ref 4.1–5.1)
Alkaline Phosphatase: 92 IU/L (ref 44–121)
BUN/Creatinine Ratio: 10 (ref 9–20)
BUN: 10 mg/dL (ref 6–20)
Bilirubin Total: 0.5 mg/dL (ref 0.0–1.2)
CO2: 24 mmol/L (ref 20–29)
Calcium: 9.8 mg/dL (ref 8.7–10.2)
Chloride: 100 mmol/L (ref 96–106)
Creatinine, Ser: 1.02 mg/dL (ref 0.76–1.27)
Globulin, Total: 2.9 g/dL (ref 1.5–4.5)
Glucose: 201 mg/dL — ABNORMAL HIGH (ref 70–99)
Potassium: 4.4 mmol/L (ref 3.5–5.2)
Sodium: 139 mmol/L (ref 134–144)
Total Protein: 7.4 g/dL (ref 6.0–8.5)
eGFR: 96 mL/min/{1.73_m2} (ref >59)

## 2023-04-23 LAB — TSH: TSH: 0.985 u[IU]/mL (ref 0.450–4.500)

## 2023-04-23 LAB — LIPID PANEL WITH LDL/HDL RATIO
Cholesterol, Total: 258 mg/dL — ABNORMAL HIGH (ref 100–199)
HDL: 45 mg/dL (ref >39)
LDL Chol Calc (NIH): 170 mg/dL — ABNORMAL HIGH (ref 0–99)
LDL/HDL Ratio: 3.8 ratio — ABNORMAL HIGH (ref 0.0–3.6)
Triglycerides: 232 mg/dL — ABNORMAL HIGH (ref 0–149)
VLDL Cholesterol Cal: 43 mg/dL — ABNORMAL HIGH (ref 5–40)

## 2023-04-23 LAB — HEMOGLOBIN A1C
Est. average glucose Bld gHb Est-mCnc: 298 mg/dL
Hgb A1c MFr Bld: 12 % — ABNORMAL HIGH (ref 4.8–5.6)

## 2023-04-26 NOTE — Telephone Encounter (Signed)
-----   Message from Mila Merry sent at 04/25/2023  8:44 AM EDT ----- Cholesterol, sugar and A1c much too high, need to take medications consistently every single day, avoid sweets and white starchy foods. Follow up with Janna in April as scheduled.

## 2023-05-18 ENCOUNTER — Ambulatory Visit: Payer: BC Managed Care – PPO | Admitting: Physician Assistant

## 2023-05-25 ENCOUNTER — Telehealth: Payer: Self-pay

## 2023-05-25 NOTE — Telephone Encounter (Signed)
 Patient was identified as falling into the [place] Measure - Diabetes.   Patient was: Appointment already scheduled for:  06/22/2023.

## 2023-06-22 ENCOUNTER — Ambulatory Visit: Admitting: Physician Assistant

## 2024-01-03 NOTE — Telephone Encounter (Signed)
 Patient was identified as falling into the [place] Measure - Diabetes.   Patient was: Attribution and/or data issue.  Validation/Investigation needed.  Explanation:  Not a patient of [facility].

## 2024-02-01 ENCOUNTER — Emergency Department
Admission: EM | Admit: 2024-02-01 | Discharge: 2024-02-01 | Disposition: A | Discharge status: Home / Self Care | Source: Home / Self Care | Attending: Emergency Medicine | Admitting: Emergency Medicine

## 2024-02-01 ENCOUNTER — Ambulatory Visit: Admitting: Medical

## 2024-02-01 ENCOUNTER — Emergency Department

## 2024-02-01 ENCOUNTER — Other Ambulatory Visit: Payer: Self-pay

## 2024-02-01 DIAGNOSIS — I10 Essential (primary) hypertension: Secondary | ICD-10-CM | POA: Diagnosis not present

## 2024-02-01 DIAGNOSIS — G43809 Other migraine, not intractable, without status migrainosus: Secondary | ICD-10-CM

## 2024-02-01 DIAGNOSIS — G238 Other specified degenerative diseases of basal ganglia: Secondary | ICD-10-CM | POA: Diagnosis not present

## 2024-02-01 DIAGNOSIS — R519 Headache, unspecified: Secondary | ICD-10-CM | POA: Diagnosis not present

## 2024-02-01 DIAGNOSIS — E119 Type 2 diabetes mellitus without complications: Secondary | ICD-10-CM | POA: Diagnosis not present

## 2024-02-01 LAB — BASIC METABOLIC PANEL WITH GFR
Anion gap: 13 (ref 5–15)
BUN: 11 mg/dL (ref 6–20)
CO2: 22 mmol/L (ref 22–32)
Calcium: 9 mg/dL (ref 8.9–10.3)
Chloride: 99 mmol/L (ref 98–111)
Creatinine, Ser: 0.89 mg/dL (ref 0.61–1.24)
GFR, Estimated: >60 mL/min (ref >60)
Glucose, Bld: 322 mg/dL — ABNORMAL HIGH (ref 70–99)
Potassium: 3.9 mmol/L (ref 3.5–5.1)
Sodium: 134 mmol/L — ABNORMAL LOW (ref 135–145)

## 2024-02-01 LAB — CBC WITH DIFFERENTIAL/PLATELET
Abs Immature Granulocytes: 0.01 K/uL (ref 0.00–0.07)
Basophils Absolute: 0 K/uL (ref 0.0–0.1)
Basophils Relative: 1 %
Eosinophils Absolute: 0 K/uL (ref 0.0–0.5)
Eosinophils Relative: 1 %
HCT: 46.6 % (ref 39.0–52.0)
Hemoglobin: 16.8 g/dL (ref 13.0–17.0)
Immature Granulocytes: 0 %
Lymphocytes Relative: 43 %
Lymphs Abs: 1.9 K/uL (ref 0.7–4.0)
MCH: 32.6 pg (ref 26.0–34.0)
MCHC: 36.1 g/dL — ABNORMAL HIGH (ref 30.0–36.0)
MCV: 90.3 fL (ref 80.0–100.0)
Monocytes Absolute: 0.3 K/uL (ref 0.1–1.0)
Monocytes Relative: 7 %
Neutro Abs: 2.2 K/uL (ref 1.7–7.7)
Neutrophils Relative %: 48 %
Platelets: 158 K/uL (ref 150–400)
RBC: 5.16 MIL/uL (ref 4.22–5.81)
RDW: 11.9 % (ref 11.5–15.5)
Smear Review: NORMAL
WBC: 4.4 K/uL (ref 4.0–10.5)
nRBC: 0 % (ref 0.0–0.2)

## 2024-02-01 MED ORDER — PROCHLORPERAZINE EDISYLATE 10 MG/2ML IJ SOLN
10.0000 mg | Freq: Once | INTRAMUSCULAR | Status: AC
  Administered 2024-02-01: 11:00:00 10 mg via INTRAVENOUS
  Filled 2024-02-01: qty 2

## 2024-02-01 MED ORDER — KETOROLAC TROMETHAMINE 30 MG/ML IJ SOLN
15.0000 mg | Freq: Once | INTRAMUSCULAR | Status: AC
  Administered 2024-02-01: 12:00:00 15 mg via INTRAVENOUS
  Filled 2024-02-01: qty 1

## 2024-02-01 MED ORDER — PROCHLORPERAZINE MALEATE 5 MG PO TABS: 5.0000 mg | ORAL_TABLET | Freq: Four times a day (QID) | ORAL | 0 refills | Status: AC | PRN

## 2024-02-01 MED ORDER — KETOROLAC TROMETHAMINE 30 MG/ML IJ SOLN: 15.0000 mg | Freq: Once | INTRAMUSCULAR | Status: DC

## 2024-02-01 MED ORDER — SODIUM CHLORIDE 0.9 % IV BOLUS
1000.0000 mL | Freq: Once | INTRAVENOUS | Status: AC
  Administered 2024-02-01: 11:00:00 1000 mL via INTRAVENOUS

## 2024-02-01 MED ORDER — DIPHENHYDRAMINE HCL 50 MG/ML IJ SOLN
25.0000 mg | Freq: Once | INTRAMUSCULAR | Status: AC
  Administered 2024-02-01: 11:00:00 25 mg via INTRAVENOUS
  Filled 2024-02-01: qty 1

## 2024-02-01 NOTE — ED Provider Notes (Signed)
 [facility] Provider Note    Event Date/Time   First MD Initiated Contact with Patient 02/01/24 1012     (approximate)   History   Chief Complaint Migraine   HPI  Stuart Mclaughlin is a 38 y.o. male with past medical history of hypertension, diabetes, and DVT who presents to the ED complaining of headache.  Patient reports that he has had a gradually worsening right-sided headache for the past 4 days.  He describes it as a throbbing starting behind his right eye and extending across the right side of his head.  He denies any fevers or neck stiffness and has not had any vision changes, speech changes, numbness, or weakness.  He reports similar migraines in the past but not as severe, has been taking over-the-counter medications without relief.  He does complain of some light sensitivity and nausea.     Physical Exam   Triage Vital Signs: ED Triage Vitals  Encounter Vitals Group     BP 02/01/24 0957 (!) 173/119     Girls Systolic BP Percentile --      Girls Diastolic BP Percentile --      Boys Systolic BP Percentile --      Boys Diastolic BP Percentile --      Pulse Rate 02/01/24 0957 62     Resp 02/01/24 0957 20     Temp 02/01/24 0956 97.8 F (36.6 C)     Temp src --      SpO2 02/01/24 0957 100 %     Weight --      Height --      Head Circumference --      Peak Flow --      Pain Score 02/01/24 0956 8     Pain Loc --      Pain Education --      Exclude from Growth Chart --     Most recent vital signs: Vitals:   02/01/24 0956 02/01/24 0957  BP:  (!) 173/119  Pulse:  62  Resp:  20  Temp: 97.8 F (36.6 C)   SpO2:  100%    Constitutional: Alert and oriented. Eyes: Conjunctivae are normal. Head: Atraumatic. Nose: No congestion/rhinnorhea. Mouth/Throat: Mucous membranes are moist.  Neck: Supple with no meningismus. Cardiovascular: Normal rate, regular rhythm. Grossly normal heart sounds.  2+ radial pulses bilaterally. Respiratory:  Normal respiratory effort.  No retractions. Lungs CTAB. Gastrointestinal: Soft and nontender. No distention. Musculoskeletal: No lower extremity tenderness nor edema.  Neurologic:  Normal speech and language. No gross focal neurologic deficits are appreciated.    ED Results / Procedures / Treatments   Labs (all labs ordered are listed, but only abnormal results are displayed) Labs Reviewed  CBC WITH DIFFERENTIAL/PLATELET - Abnormal; Notable for the following components:      Result Value   MCHC 36.1 (*)    All other components within normal limits  BASIC METABOLIC PANEL WITH GFR - Abnormal; Notable for the following components:   Sodium 134 (*)    Glucose, Bld 322 (*)    All other components within normal limits    ED ECG REPORT I, Carlin Palin, the attending physician, personally viewed and interpreted this ECG.   Date: 02/01/2024  EKG Time: 10:49  Rate: 52  Rhythm: sinus bradycardia  Axis: Normal  Intervals:none  ST&T Change: None  RADIOLOGY CT head reviewed and interpreted by me with no hemorrhage or midline shift.  PROCEDURES:  Critical Care performed: No  Procedures   MEDICATIONS ORDERED IN ED: Medications  prochlorperazine  (COMPAZINE ) injection 10 mg (10 mg Intravenous Given 02/01/24 1050)  diphenhydrAMINE  (BENADRYL ) injection 25 mg (25 mg Intravenous Given 02/01/24 1050)  sodium chloride  0.9 % bolus 1,000 mL (0 mLs Intravenous Stopped 02/01/24 1226)  ketorolac  (TORADOL ) 30 MG/ML injection 15 mg (15 mg Intravenous Given 02/01/24 1154)     IMPRESSION / MDM / ASSESSMENT AND PLAN / ED COURSE  I reviewed the triage vital signs and the nursing notes.                              38 y.o. male with past medical history of hypertension, diabetes, and DVT who presents to the ED complaining of gradually worsening right-sided headache over the past 4 days.  Patient's presentation is most consistent with acute presentation with potential threat to life or bodily  function.  Differential diagnosis includes, but is not limited to, tension headache, migraine headache, SAH, meningitis.  Patient nontoxic-appearing and in no acute distress, vital signs remarkable for hypertension but otherwise reassuring.  He has a nonfocal neurologic exam and symptoms sound most consistent with migraine, no features concerning for [facility] or meningitis.  Plan to check CT head given significantly elevated BP.  We will treat symptomatically with IV Compazine  and Benadryl , hydrate with IV fluids.  CT head is negative for acute process, labs with hyperglycemia but no evidence of anemia, leukocytosis, electrolyte abnormality, or AKI.  He reports feeling significantly better following IV Compazine , Benadryl , and Toradol .  No evidence of hypertensive emergency at this time and patient reports intermittent compliance with his blood pressure medications.  He is appropriate for discharge home with outpatient follow-up, was counseled to return to the ED for new or worsening symptoms.  Patient agrees with plan.      FINAL CLINICAL IMPRESSION(S) / ED DIAGNOSES   Final diagnoses:  Other migraine without status migrainosus, not intractable  Uncontrolled hypertension     Rx / DC Orders   ED Discharge Orders          Ordered    prochlorperazine  (COMPAZINE ) 5 MG tablet  Every 6 hours PRN        02/01/24 1221             Note:  This document was prepared using Dragon voice recognition software and may include unintentional dictation errors.   Willo Dunnings, MD 02/01/24 1226

## 2024-02-01 NOTE — ED Triage Notes (Signed)
 Pt to ED via POV from home. Pt reports getting over head cold and started to have a migraine 3 days ago with no relief. Pt reports some light sensitivity. No hx of migraines.

## 2024-03-03 ENCOUNTER — Other Ambulatory Visit: Payer: Self-pay | Admitting: Physician Assistant

## 2024-03-03 DIAGNOSIS — Z794 Long term (current) use of insulin: Secondary | ICD-10-CM

## 2024-03-03 MED ORDER — LANTUS SOLOSTAR 100 UNIT/ML ~~LOC~~ SOPN: 30.0000 [IU] | PEN_INJECTOR | Freq: Every day | SUBCUTANEOUS | 0 refills | Status: AC

## 2024-03-03 NOTE — Telephone Encounter (Signed)
 Copied from CRM [id number]. Topic: Clinical - Medication Refill >> Mar 03, 2024 10:49 AM Delon HERO wrote: Medication: insulin  glargine (LANTUS  SOLOSTAR) 100 UNIT/ML Solostar Pen [[id number]]  Has the patient contacted their pharmacy? Yes (Agent: If no, request that the patient contact the pharmacy for the refill. If patient does not wish to contact the pharmacy document the reason why and proceed with request.) (Agent: If yes, when and what did the pharmacy advise?)  This is the patient's preferred pharmacy:  [id number] - [address] [place] [address] Phone: [phone] Fax: [phone]    Is this the correct pharmacy for this prescription? Yes If no, delete pharmacy and type the correct one.   Has the prescription been filled recently? Yes  Is the patient out of the medication? Yes  Has the patient been seen for an appointment in the last year OR does the patient have an upcoming appointment? Yes  Can we respond through MyChart? Yes  Agent: Please be advised that Rx refills may take up to 3 business days. We ask that you follow-up with your pharmacy.

## 2024-04-14 ENCOUNTER — Ambulatory Visit: Admitting: Physician Assistant
